# Patient Record
Sex: Female | Born: 1955 | Race: White | Hispanic: No | Marital: Married | Smoking: Former smoker
Health system: Southern US, Community
[De-identification: ages and names within clinical notes are randomized; demographics above are authoritative.]

## PROBLEM LIST (undated history)

## (undated) DIAGNOSIS — N183 Chronic kidney disease, stage 3 unspecified: Secondary | ICD-10-CM

## (undated) DIAGNOSIS — J330 Polyp of nasal cavity: Secondary | ICD-10-CM

## (undated) DIAGNOSIS — I890 Lymphedema, not elsewhere classified: Secondary | ICD-10-CM

## (undated) DIAGNOSIS — I5032 Chronic diastolic (congestive) heart failure: Secondary | ICD-10-CM

## (undated) DIAGNOSIS — E662 Morbid (severe) obesity with alveolar hypoventilation: Secondary | ICD-10-CM

## (undated) DIAGNOSIS — G5600 Carpal tunnel syndrome, unspecified upper limb: Secondary | ICD-10-CM

## (undated) DIAGNOSIS — M109 Gout, unspecified: Secondary | ICD-10-CM

## (undated) DIAGNOSIS — Z87891 Personal history of nicotine dependence: Secondary | ICD-10-CM

## (undated) DIAGNOSIS — J9610 Chronic respiratory failure, unspecified whether with hypoxia or hypercapnia: Secondary | ICD-10-CM

## (undated) DIAGNOSIS — I10 Essential (primary) hypertension: Secondary | ICD-10-CM

## (undated) DIAGNOSIS — Z9981 Dependence on supplemental oxygen: Secondary | ICD-10-CM

## (undated) DIAGNOSIS — E119 Type 2 diabetes mellitus without complications: Secondary | ICD-10-CM

## (undated) DIAGNOSIS — E785 Hyperlipidemia, unspecified: Secondary | ICD-10-CM

## (undated) DIAGNOSIS — C519 Malignant neoplasm of vulva, unspecified: Secondary | ICD-10-CM

## (undated) DIAGNOSIS — J411 Mucopurulent chronic bronchitis: Secondary | ICD-10-CM

## (undated) DIAGNOSIS — M1990 Unspecified osteoarthritis, unspecified site: Secondary | ICD-10-CM

## (undated) DIAGNOSIS — K219 Gastro-esophageal reflux disease without esophagitis: Secondary | ICD-10-CM

## (undated) HISTORY — DX: Chronic kidney disease, stage 3 unspecified: N18.30

## (undated) HISTORY — DX: Carpal tunnel syndrome, unspecified upper limb: G56.00

## (undated) HISTORY — PX: COLONOSCOPY W/ POLYPECTOMY: SHX1380

## (undated) HISTORY — DX: Type 2 diabetes mellitus without complications: E11.9

## (undated) HISTORY — DX: Polyp of nasal cavity: J33.0

## (undated) HISTORY — PX: RADICAL VULVECTOMY: SHX2286

## (undated) HISTORY — DX: Hyperlipidemia, unspecified: E78.5

## (undated) HISTORY — DX: Lymphedema, not elsewhere classified: I89.0

## (undated) HISTORY — DX: Chronic diastolic (congestive) heart failure: I50.32

## (undated) HISTORY — DX: Unspecified osteoarthritis, unspecified site: M19.90

## (undated) HISTORY — DX: Gastro-esophageal reflux disease without esophagitis: K21.9

## (undated) HISTORY — DX: Essential (primary) hypertension: I10

## (undated) HISTORY — DX: Morbid (severe) obesity with alveolar hypoventilation: E66.2

## (undated) HISTORY — DX: Dependence on supplemental oxygen: Z99.81

## (undated) HISTORY — PX: LYMPHADENECTOMY: SHX15

## (undated) HISTORY — DX: Chronic respiratory failure, unspecified whether with hypoxia or hypercapnia: J96.10

## (undated) HISTORY — DX: Chronic kidney disease, stage 3 (moderate): N18.3

---

## 1999-11-22 ENCOUNTER — Other Ambulatory Visit: Admission: RE | Admit: 1999-11-22 | Discharge: 1999-11-22 | Payer: Self-pay | Admitting: Obstetrics and Gynecology

## 1999-11-22 ENCOUNTER — Encounter (INDEPENDENT_AMBULATORY_CARE_PROVIDER_SITE_OTHER): Payer: Self-pay | Admitting: Specialist

## 1999-11-29 ENCOUNTER — Ambulatory Visit: Admission: RE | Admit: 1999-11-29 | Discharge: 1999-11-29 | Payer: Self-pay | Admitting: Gynecologic Oncology

## 1999-12-28 ENCOUNTER — Ambulatory Visit: Admission: RE | Admit: 1999-12-28 | Discharge: 1999-12-28 | Payer: Self-pay | Admitting: Gynecology

## 2000-01-03 ENCOUNTER — Ambulatory Visit: Admission: RE | Admit: 2000-01-03 | Discharge: 2000-01-03 | Payer: Self-pay | Admitting: Gynecologic Oncology

## 2000-01-18 ENCOUNTER — Ambulatory Visit: Admission: RE | Admit: 2000-01-18 | Discharge: 2000-01-18 | Payer: Self-pay | Admitting: Gynecology

## 2000-02-29 ENCOUNTER — Ambulatory Visit: Admission: RE | Admit: 2000-02-29 | Discharge: 2000-02-29 | Payer: Self-pay | Admitting: Gynecologic Oncology

## 2000-05-22 ENCOUNTER — Ambulatory Visit: Admission: RE | Admit: 2000-05-22 | Discharge: 2000-05-22 | Payer: Self-pay | Admitting: Gynecologic Oncology

## 2000-08-14 ENCOUNTER — Ambulatory Visit: Admission: RE | Admit: 2000-08-14 | Discharge: 2000-08-14 | Payer: Self-pay | Admitting: Gynecology

## 2000-08-14 ENCOUNTER — Other Ambulatory Visit: Admission: RE | Admit: 2000-08-14 | Discharge: 2000-08-14 | Payer: Self-pay | Admitting: Gynecology

## 2000-11-06 ENCOUNTER — Other Ambulatory Visit: Admission: RE | Admit: 2000-11-06 | Discharge: 2000-11-06 | Payer: Self-pay | Admitting: Gynecology

## 2000-11-06 ENCOUNTER — Ambulatory Visit: Admission: RE | Admit: 2000-11-06 | Discharge: 2000-11-06 | Payer: Self-pay | Admitting: Gynecology

## 2001-02-19 ENCOUNTER — Other Ambulatory Visit: Admission: RE | Admit: 2001-02-19 | Discharge: 2001-02-19 | Payer: Self-pay | Admitting: Gynecology

## 2001-02-19 ENCOUNTER — Ambulatory Visit: Admission: RE | Admit: 2001-02-19 | Discharge: 2001-02-19 | Payer: Self-pay | Admitting: Gynecology

## 2001-05-13 ENCOUNTER — Ambulatory Visit: Admission: RE | Admit: 2001-05-13 | Discharge: 2001-05-13 | Payer: Self-pay | Admitting: Gynecology

## 2001-07-24 ENCOUNTER — Other Ambulatory Visit: Admission: RE | Admit: 2001-07-24 | Discharge: 2001-07-24 | Payer: Self-pay | Admitting: Gynecologic Oncology

## 2001-07-24 ENCOUNTER — Ambulatory Visit: Admission: RE | Admit: 2001-07-24 | Discharge: 2001-07-24 | Payer: Self-pay | Admitting: Gynecologic Oncology

## 2001-07-24 ENCOUNTER — Encounter (INDEPENDENT_AMBULATORY_CARE_PROVIDER_SITE_OTHER): Payer: Self-pay | Admitting: *Deleted

## 2001-09-02 ENCOUNTER — Encounter (INDEPENDENT_AMBULATORY_CARE_PROVIDER_SITE_OTHER): Payer: Self-pay

## 2001-09-02 ENCOUNTER — Ambulatory Visit (HOSPITAL_COMMUNITY): Admission: RE | Admit: 2001-09-02 | Discharge: 2001-09-02 | Payer: Self-pay | Admitting: Physical Therapy

## 2001-11-07 ENCOUNTER — Other Ambulatory Visit: Admission: RE | Admit: 2001-11-07 | Discharge: 2001-11-07 | Payer: Self-pay | Admitting: Obstetrics and Gynecology

## 2002-01-31 ENCOUNTER — Ambulatory Visit: Admission: RE | Admit: 2002-01-31 | Discharge: 2002-01-31 | Payer: Self-pay | Admitting: Gynecologic Oncology

## 2002-04-30 ENCOUNTER — Ambulatory Visit: Admission: RE | Admit: 2002-04-30 | Discharge: 2002-04-30 | Payer: Self-pay | Admitting: Gynecologic Oncology

## 2002-04-30 ENCOUNTER — Other Ambulatory Visit: Admission: RE | Admit: 2002-04-30 | Discharge: 2002-04-30 | Payer: Self-pay | Admitting: Gynecologic Oncology

## 2002-07-21 ENCOUNTER — Emergency Department (HOSPITAL_COMMUNITY): Admission: EM | Admit: 2002-07-21 | Discharge: 2002-07-21 | Payer: Self-pay | Admitting: Emergency Medicine

## 2002-07-21 ENCOUNTER — Encounter: Payer: Self-pay | Admitting: Emergency Medicine

## 2002-07-28 ENCOUNTER — Encounter: Admission: RE | Admit: 2002-07-28 | Discharge: 2002-07-28 | Payer: Self-pay | Admitting: Internal Medicine

## 2002-08-21 ENCOUNTER — Encounter: Admission: RE | Admit: 2002-08-21 | Discharge: 2002-08-21 | Payer: Self-pay | Admitting: Obstetrics and Gynecology

## 2002-09-10 ENCOUNTER — Encounter: Admission: RE | Admit: 2002-09-10 | Discharge: 2002-09-10 | Payer: Self-pay | Admitting: Internal Medicine

## 2002-09-17 ENCOUNTER — Ambulatory Visit (HOSPITAL_COMMUNITY): Admission: RE | Admit: 2002-09-17 | Discharge: 2002-09-17 | Payer: Self-pay | Admitting: Internal Medicine

## 2002-09-20 ENCOUNTER — Encounter: Payer: Self-pay | Admitting: Internal Medicine

## 2002-09-20 ENCOUNTER — Ambulatory Visit (HOSPITAL_COMMUNITY): Admission: RE | Admit: 2002-09-20 | Discharge: 2002-09-20 | Payer: Self-pay | Admitting: Internal Medicine

## 2002-10-02 ENCOUNTER — Encounter: Payer: Self-pay | Admitting: Internal Medicine

## 2002-10-02 ENCOUNTER — Encounter: Admission: RE | Admit: 2002-10-02 | Discharge: 2002-10-02 | Payer: Self-pay | Admitting: Internal Medicine

## 2002-10-08 ENCOUNTER — Encounter: Admission: RE | Admit: 2002-10-08 | Discharge: 2002-10-08 | Payer: Self-pay | Admitting: Internal Medicine

## 2002-11-03 ENCOUNTER — Encounter: Admission: RE | Admit: 2002-11-03 | Discharge: 2002-11-03 | Payer: Self-pay | Admitting: Obstetrics and Gynecology

## 2002-12-04 ENCOUNTER — Encounter: Payer: Self-pay | Admitting: Emergency Medicine

## 2002-12-04 ENCOUNTER — Emergency Department (HOSPITAL_COMMUNITY): Admission: EM | Admit: 2002-12-04 | Discharge: 2002-12-04 | Payer: Self-pay | Admitting: Emergency Medicine

## 2002-12-18 ENCOUNTER — Encounter: Admission: RE | Admit: 2002-12-18 | Discharge: 2002-12-18 | Payer: Self-pay | Admitting: Internal Medicine

## 2003-01-26 ENCOUNTER — Encounter: Admission: RE | Admit: 2003-01-26 | Discharge: 2003-01-26 | Payer: Self-pay | Admitting: Obstetrics and Gynecology

## 2003-03-12 ENCOUNTER — Encounter: Admission: RE | Admit: 2003-03-12 | Discharge: 2003-03-12 | Payer: Self-pay | Admitting: Internal Medicine

## 2003-03-16 ENCOUNTER — Encounter: Admission: RE | Admit: 2003-03-16 | Discharge: 2003-03-16 | Payer: Self-pay | Admitting: Internal Medicine

## 2003-04-20 ENCOUNTER — Encounter: Admission: RE | Admit: 2003-04-20 | Discharge: 2003-04-20 | Payer: Self-pay | Admitting: Obstetrics and Gynecology

## 2003-05-20 ENCOUNTER — Encounter (INDEPENDENT_AMBULATORY_CARE_PROVIDER_SITE_OTHER): Payer: Self-pay | Admitting: Specialist

## 2003-05-20 ENCOUNTER — Ambulatory Visit: Admission: RE | Admit: 2003-05-20 | Discharge: 2003-05-20 | Payer: Self-pay | Admitting: Gynecology

## 2003-05-20 ENCOUNTER — Other Ambulatory Visit: Admission: RE | Admit: 2003-05-20 | Discharge: 2003-05-20 | Payer: Self-pay | Admitting: Gynecology

## 2003-07-13 ENCOUNTER — Encounter: Admission: RE | Admit: 2003-07-13 | Discharge: 2003-07-13 | Payer: Self-pay | Admitting: Family Medicine

## 2003-08-04 ENCOUNTER — Encounter: Admission: RE | Admit: 2003-08-04 | Discharge: 2003-08-04 | Payer: Self-pay | Admitting: Internal Medicine

## 2003-08-13 ENCOUNTER — Ambulatory Visit (HOSPITAL_COMMUNITY): Admission: RE | Admit: 2003-08-13 | Discharge: 2003-08-13 | Payer: Self-pay | Admitting: Internal Medicine

## 2003-08-21 ENCOUNTER — Encounter: Admission: RE | Admit: 2003-08-21 | Discharge: 2003-08-21 | Payer: Self-pay | Admitting: Internal Medicine

## 2003-09-28 ENCOUNTER — Encounter: Admission: RE | Admit: 2003-09-28 | Discharge: 2003-09-28 | Payer: Self-pay | Admitting: Obstetrics and Gynecology

## 2003-12-21 ENCOUNTER — Encounter: Admission: RE | Admit: 2003-12-21 | Discharge: 2003-12-21 | Payer: Self-pay | Admitting: Obstetrics and Gynecology

## 2004-01-22 ENCOUNTER — Encounter: Admission: RE | Admit: 2004-01-22 | Discharge: 2004-01-22 | Payer: Self-pay | Admitting: Internal Medicine

## 2004-02-12 ENCOUNTER — Encounter: Admission: RE | Admit: 2004-02-12 | Discharge: 2004-02-12 | Payer: Self-pay | Admitting: Internal Medicine

## 2004-03-14 ENCOUNTER — Encounter: Admission: RE | Admit: 2004-03-14 | Discharge: 2004-03-14 | Payer: Self-pay | Admitting: Obstetrics and Gynecology

## 2004-04-06 ENCOUNTER — Encounter: Admission: RE | Admit: 2004-04-06 | Discharge: 2004-04-06 | Payer: Self-pay | Admitting: Internal Medicine

## 2004-05-10 ENCOUNTER — Other Ambulatory Visit: Admission: RE | Admit: 2004-05-10 | Discharge: 2004-05-10 | Payer: Self-pay | Admitting: Gynecologic Oncology

## 2004-05-10 ENCOUNTER — Encounter (INDEPENDENT_AMBULATORY_CARE_PROVIDER_SITE_OTHER): Payer: Self-pay | Admitting: *Deleted

## 2004-05-10 ENCOUNTER — Ambulatory Visit: Admission: RE | Admit: 2004-05-10 | Discharge: 2004-05-10 | Payer: Self-pay | Admitting: Gynecologic Oncology

## 2004-05-31 ENCOUNTER — Ambulatory Visit: Payer: Self-pay | Admitting: Obstetrics & Gynecology

## 2004-07-11 ENCOUNTER — Ambulatory Visit: Payer: Self-pay | Admitting: Internal Medicine

## 2004-07-12 ENCOUNTER — Ambulatory Visit (HOSPITAL_COMMUNITY): Admission: RE | Admit: 2004-07-12 | Discharge: 2004-07-12 | Payer: Self-pay | Admitting: Internal Medicine

## 2004-07-12 ENCOUNTER — Ambulatory Visit: Payer: Self-pay | Admitting: Internal Medicine

## 2004-07-15 ENCOUNTER — Ambulatory Visit: Payer: Self-pay | Admitting: Internal Medicine

## 2004-07-22 ENCOUNTER — Emergency Department (HOSPITAL_COMMUNITY): Admission: EM | Admit: 2004-07-22 | Discharge: 2004-07-22 | Payer: Self-pay | Admitting: Family Medicine

## 2004-07-27 ENCOUNTER — Ambulatory Visit: Payer: Self-pay | Admitting: Internal Medicine

## 2004-08-05 ENCOUNTER — Ambulatory Visit: Payer: Self-pay | Admitting: Internal Medicine

## 2004-08-16 ENCOUNTER — Ambulatory Visit: Payer: Self-pay | Admitting: *Deleted

## 2004-09-06 ENCOUNTER — Ambulatory Visit: Payer: Self-pay | Admitting: Internal Medicine

## 2004-09-08 ENCOUNTER — Ambulatory Visit: Payer: Self-pay | Admitting: Internal Medicine

## 2004-10-26 ENCOUNTER — Ambulatory Visit: Payer: Self-pay | Admitting: Internal Medicine

## 2004-11-01 ENCOUNTER — Ambulatory Visit: Payer: Self-pay | Admitting: Obstetrics and Gynecology

## 2004-11-09 ENCOUNTER — Ambulatory Visit: Payer: Self-pay | Admitting: Internal Medicine

## 2005-01-19 ENCOUNTER — Ambulatory Visit: Payer: Self-pay | Admitting: Obstetrics & Gynecology

## 2005-01-31 ENCOUNTER — Ambulatory Visit: Payer: Self-pay | Admitting: Internal Medicine

## 2005-02-04 ENCOUNTER — Ambulatory Visit (HOSPITAL_COMMUNITY): Admission: RE | Admit: 2005-02-04 | Discharge: 2005-02-04 | Payer: Self-pay | Admitting: Internal Medicine

## 2005-04-20 ENCOUNTER — Ambulatory Visit: Payer: Self-pay | Admitting: Obstetrics & Gynecology

## 2005-05-04 ENCOUNTER — Ambulatory Visit: Payer: Self-pay | Admitting: Internal Medicine

## 2005-05-09 ENCOUNTER — Encounter (INDEPENDENT_AMBULATORY_CARE_PROVIDER_SITE_OTHER): Payer: Self-pay | Admitting: *Deleted

## 2005-05-09 ENCOUNTER — Ambulatory Visit: Admission: RE | Admit: 2005-05-09 | Discharge: 2005-05-09 | Payer: Self-pay | Admitting: Gynecologic Oncology

## 2005-05-09 ENCOUNTER — Other Ambulatory Visit: Admission: RE | Admit: 2005-05-09 | Discharge: 2005-05-09 | Payer: Self-pay | Admitting: Gynecologic Oncology

## 2005-07-06 ENCOUNTER — Ambulatory Visit: Payer: Self-pay | Admitting: Obstetrics & Gynecology

## 2005-08-15 ENCOUNTER — Ambulatory Visit: Payer: Self-pay | Admitting: Internal Medicine

## 2005-09-21 ENCOUNTER — Ambulatory Visit: Payer: Self-pay | Admitting: Family Medicine

## 2005-11-17 ENCOUNTER — Ambulatory Visit: Payer: Self-pay | Admitting: Internal Medicine

## 2005-12-06 ENCOUNTER — Ambulatory Visit: Payer: Self-pay | Admitting: Gynecology

## 2005-12-06 ENCOUNTER — Ambulatory Visit (HOSPITAL_COMMUNITY): Admission: RE | Admit: 2005-12-06 | Discharge: 2005-12-06 | Payer: Self-pay | Admitting: Internal Medicine

## 2005-12-28 ENCOUNTER — Encounter: Admission: RE | Admit: 2005-12-28 | Discharge: 2005-12-28 | Payer: Self-pay | Admitting: Internal Medicine

## 2006-07-18 DIAGNOSIS — C519 Malignant neoplasm of vulva, unspecified: Secondary | ICD-10-CM | POA: Insufficient documentation

## 2006-07-18 DIAGNOSIS — K219 Gastro-esophageal reflux disease without esophagitis: Secondary | ICD-10-CM

## 2006-07-18 DIAGNOSIS — R32 Unspecified urinary incontinence: Secondary | ICD-10-CM | POA: Insufficient documentation

## 2006-07-18 DIAGNOSIS — R609 Edema, unspecified: Secondary | ICD-10-CM

## 2006-07-18 DIAGNOSIS — I10 Essential (primary) hypertension: Secondary | ICD-10-CM | POA: Insufficient documentation

## 2006-07-18 DIAGNOSIS — N912 Amenorrhea, unspecified: Secondary | ICD-10-CM

## 2006-07-18 DIAGNOSIS — Z87891 Personal history of nicotine dependence: Secondary | ICD-10-CM

## 2006-07-18 DIAGNOSIS — M8700 Idiopathic aseptic necrosis of unspecified bone: Secondary | ICD-10-CM | POA: Insufficient documentation

## 2006-07-18 DIAGNOSIS — G5600 Carpal tunnel syndrome, unspecified upper limb: Secondary | ICD-10-CM | POA: Insufficient documentation

## 2006-09-15 DIAGNOSIS — J329 Chronic sinusitis, unspecified: Secondary | ICD-10-CM | POA: Insufficient documentation

## 2006-09-15 DIAGNOSIS — J309 Allergic rhinitis, unspecified: Secondary | ICD-10-CM | POA: Insufficient documentation

## 2007-05-29 ENCOUNTER — Encounter: Admission: RE | Admit: 2007-05-29 | Discharge: 2007-05-29 | Payer: Self-pay | Admitting: Family Medicine

## 2008-08-22 IMAGING — OT DG DXA BONE DENSITY STUDY HL7
4 series · 4 of 4 positions shown · non-contrast
Comparison: none

DUAL X-RAY ABSORPTIOMETRY (BONE MINERAL DENSITY):
CLINICAL DATA: Per physician?s office, patient is felt to be perimenopausal. There is a history of rheumatoid arthritis and osteoarthritis.  Patient takes calcium and vitamin D.  She has been on Depo-Provera for seven years.

[Series 13: — · left · 1 of 1 slices shown (1 of 4)]
[im 1/1]
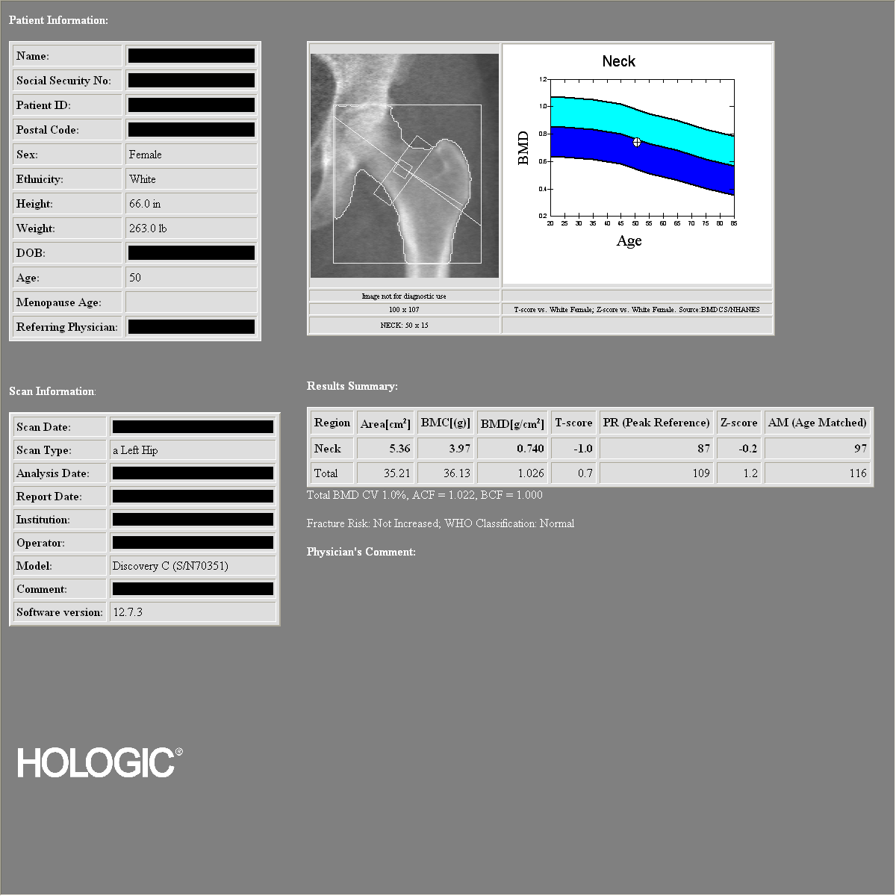

[Series 14: — · 1 of 1 slices shown (2 of 4)]
[im 1/1]
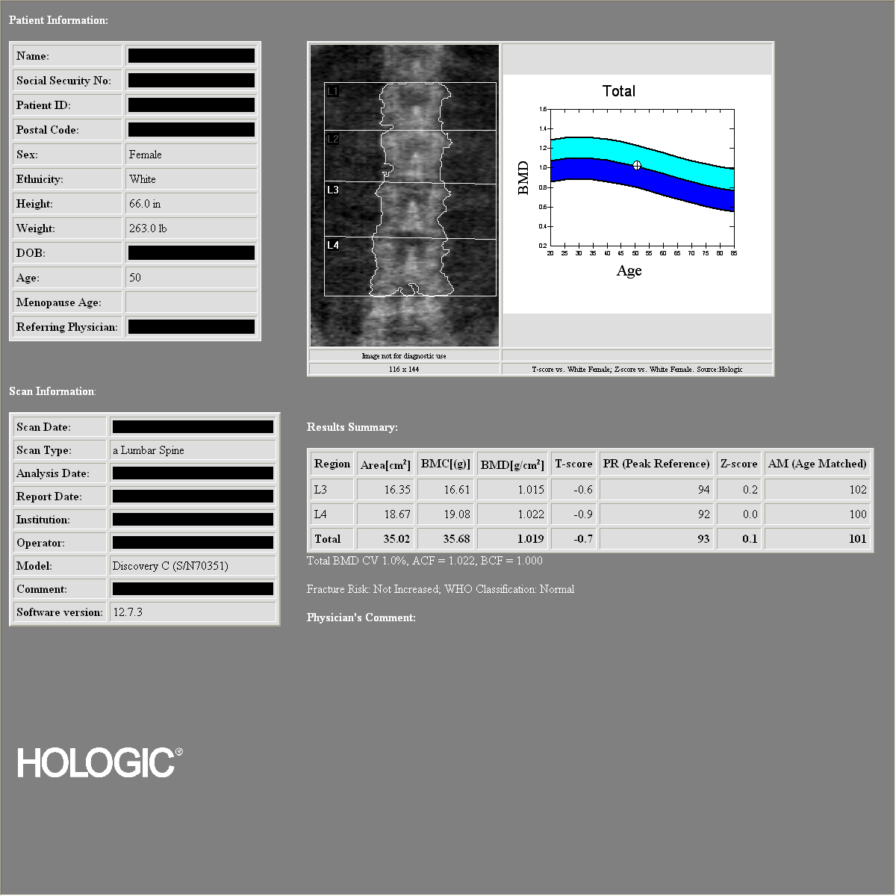

[Series 15: — · left · 1 of 1 slices shown (3 of 4)]
[im 1/1]
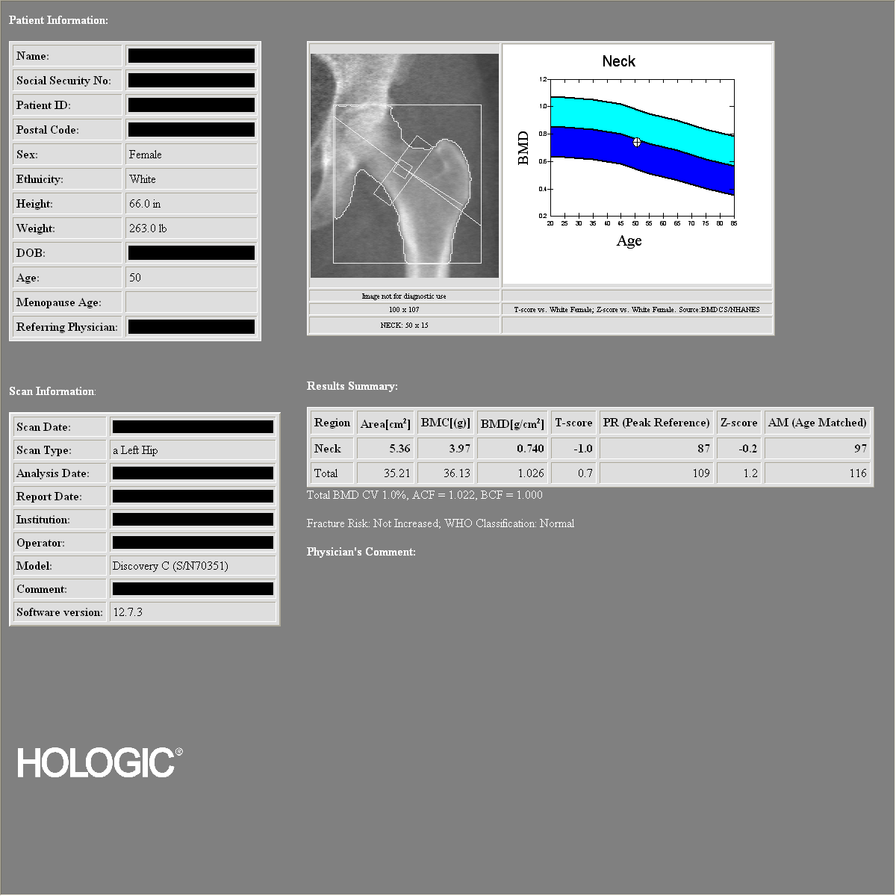

[Series 16: — · 1 of 1 slices shown (4 of 4)]
[im 1/1]
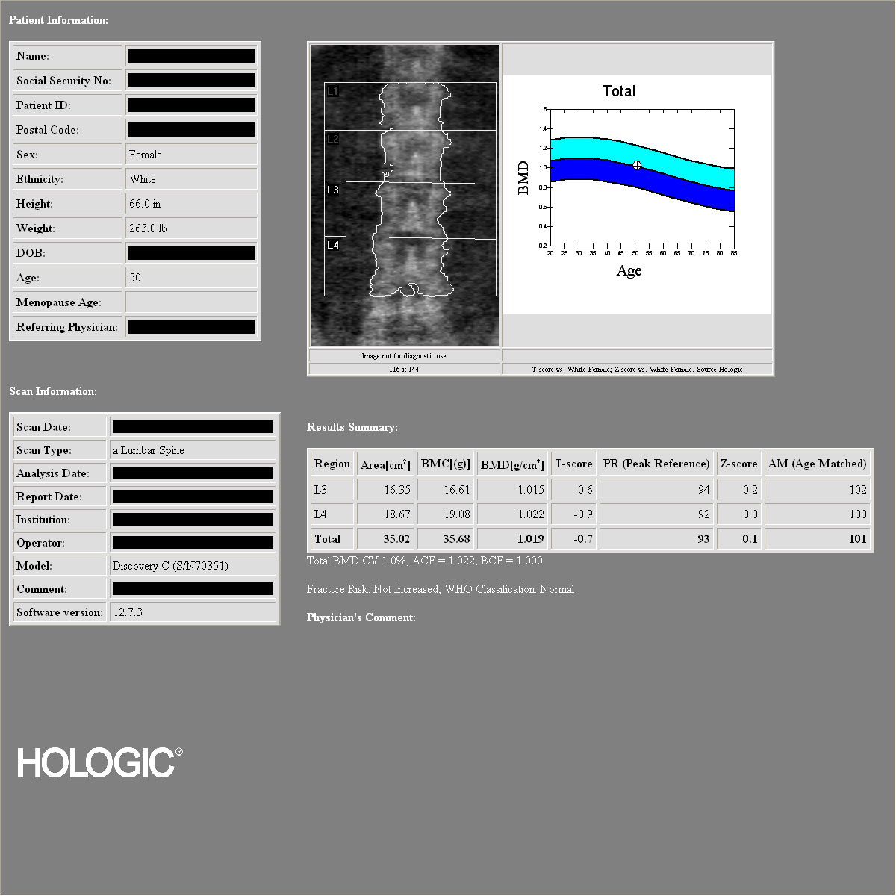

[4 of 4 positions shown; findings below may reference images not displayed]

LUMBAR SPINE (L3-L4)
 BMD:
 T-score (% of young adult value):  -0.7
 Z-score (% adult age match value):

 LEFT HIP (NECK)
 BMD:
 T-score (% of young adult value):  -1.0
 Z-score (% adult age match value):  -0.2

 Assessment:    This patient is considered normal according to the World Health Organization (WHO) criteria.

 Fracture Risk:    Not increased.

 Discussion:    There has been no significant change in bone mineral density in the spine or hip as compared to 10/02/02.  L1 and L2 were excluded due to greater than one standard deviation difference in T-score from adjacent vertebral bodies. All patients should ensure an adequate intake of dietary calcium (4944 mg daily) and vitamin D ([address]) unless contraindicated.  

 DXA-Re-Evaluation Recommendation:   Consider follow-up DXA in two years.  

 World Health Organization Criteria for Osteoporosis 
 Diagnostic Category
 Normal:  T-Score > -1.0
 Osteopenia:   T-Score: < 1.0 and >
 Osteoporosis:   T-Score: < 2.5) w/o history of fx)
 Established Osteoporosis:  T-Score < 2.5 (with history of fx)
 *Note:  T-Score = SD of young adult mean

## 2009-01-14 ENCOUNTER — Encounter: Admission: RE | Admit: 2009-01-14 | Discharge: 2009-01-14 | Payer: Self-pay | Admitting: Family Medicine

## 2009-06-01 ENCOUNTER — Encounter: Admission: RE | Admit: 2009-06-01 | Discharge: 2009-06-01 | Payer: Self-pay | Admitting: Family Medicine

## 2011-01-20 NOTE — Consult Note (Signed)
NAME:  [name] [name], [name] [name]                        ACCOUNT NO.:  [id number]   MEDICAL RECORD NO.:  [id number]                   PATIENT TYPE:  OUT   LOCATION:  GYN                                  FACILITY:  [facility]   PHYSICIAN:  John T. Kyla Balzarine, M.D.                 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   HISTORY OF PRESENT ILLNESS:  The patient underwent radical vulvectomy and  bilateral inguinal lymphadenectomy in April 2001.  She underwent Martius  flap reconstruction for a locally very advanced, but superficially invasive  squamous malignancy.  Nodes were negative.  She has had problems with  intermittent lymphedema.   INTERVAL HISTORY:  Since she was last seen she was evaluated by a  gastroenterologist with negative colonoscopy.  Last Pap smear in March was  normal.   PAST MEDICAL HISTORY:  Peptic ulcer disease, cesarean section, chronic  lymphedema.   PAST SURGICAL HISTORY:  As above.   CURRENT MEDICATIONS:  Prevacid, Depo Provera, and Aldactazide.   ALLERGIES:  None.   SOCIAL HISTORY:  Essentially unchanged from those recorded during her  surgical admission at [facility] and as noted above.   FAMILY HISTORY:  Essentially unchanged from those recorded during her  surgical admission at [facility] and as noted above.   REVIEW OF SYMPTOMS:  Essentially unchanged from those recorded during her  surgical admission at [facility] and as noted above.   PHYSICAL EXAMINATION:  VITAL SIGNS:  Weight 234 pounds, blood pressure  140/86.  GENERAL:  The patient is alert and oriented x3, in no acute distress.  LYMPH:  There is no pathologic lymphadenopathy.  LUNGS:  Clear.  BACK:  There is no back or CVA tenderness.  ABDOMEN:  Soft and benign without ascites, mass, or organomegaly.  EXTREMITIES:  Without edema.  PELVIC:  External genitalia and BUS  are scarred from prior surgical  procedure but there is no evidence of an acetowhite lesion or condylomatous  lesion.  The vagina is clear as is the cervix.  Bimanual and rectovaginal  examinations reveal normal uterus and cervix within the limits for obesity.  Adnexa are nonpalpable.  RECTAL:  Normal and confirmatory.   ASSESSMENT:  Vulvar carcinoma, NED.   PLAN:  Depo Provera is given and will be repeated every few months.  She  will continue to alternate follow-up between Dr. Katrinka Blazing and myself.                                               John T. Kyla Balzarine, M.D.    JTS/MEDQ  D:  04/30/2002  T:  05/01/2002  Job:  [id number]   cc:   Laqueta Linden, M.D.  [address], [address]  [place]  [place] [address]  Fax: [phone]   Rema Fendt   Barrie Folk, M.D.  [phone] [address]  [place]  [place] [address]  Fax: [phone]

## 2011-01-20 NOTE — Consult Note (Signed)
[facility]  Patient:    [name] [name], [name] [name]                           MRN: [id number] Adm. Date:  [id number] Attending:  Ronita Hipps [place] CC:         Laqueta Linden, M.D.             Telford Nab, R.N., GYN Oncology Unit, [facility].                          Consultation Report  HISTORY OF PRESENT ILLNESS:  This is a 55 year old woman who returns for ongoing followup after modified radical vulvectomy and bilateral inguinal lymphadenectomy at [facility] on December 15, 1999.  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  She had negative inguinal lymph nodes.  Postoperatively, she had right lymphedema which responded favorably to Aldactazide 25/25.  She notes no difficulty in ambulation but she does note a sensation of "burning" discomfort,  particularly at night, in the distribution of the right femoral cutaneous nerve. She denies cords or tenderness in the calves.  Her Jackson-Pratt drains have been discontinued and she is treating a posterior wound separation with Sitz baths three to four times daily.  She is doing quite well with this but does note some irritation and "galding" of the labial-crural fold.  PHYSICAL EXAMINATION:  VITAL SIGNS:  Afebrile and benign.  PELVIC:  Groin incisions are well-healed without inflammation, although there is superficial skin necrosis in the mid left incision, back approximately 1 cm from the incision.  There is erythema and thickening of the labial-crural folds without evidence of Monilia.  There is edema of the mons.  The posterior suture line has separated across the perineum and into the left gluteal region but is granulating well, without areas of necrosis and the anterior suture line is intact. Residual stitches are removed.  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  PLAN:  Patient was encouraged to continue her Sitz baths and topical care.  She was given a prescription for 1% hydrocortisone ointment to be used in the labial-crural folds.  We will see her back for followup in approximal two weeks. DD:  01/03/00 TD:  01/04/00 Job: [id number]

## 2011-01-20 NOTE — Procedures (Signed)
[facility]  Patient:    [name] [name], [name] [name] Visit Number: [id number] MRN: [id number]          Service Type: END Location: ENDO Attending Physician:  Louie Bun Proc. Date: 09/02/01 Admit Date:  09/02/2001   CC:         John T. Kyla Balzarine, M.D.                           Procedure Report  PROCEDURE PERFORMED:  Colonoscopy with polypectomy.  ENDOSCOPIST:  Everardo All. Madilyn Fireman, M.D.  INDICATION:  Rectal bleeding in a patient recently status post surgery for vulvar carcinoma.  DESCRIPTION OF PROCEDURE:  The patient was placed in the left lateral decubitus position and placed on the pulse monitor with continuous low flow oxygen delivered via nasal cannula.  She was sedated with 100 mcg IV Demerol and 10 mg IV Versed.  The Olympus video colonoscope was inserted into the rectum and advanced to the cecum, confirmed by transillumination of McBurneys point, and visualization of the ileocecal valve and appendiceal orifice.  The prep was fair.  There were some areas of limitation that I could not preclude small lesions less than 1 cm in all areas, otherwise the cecum and ascending colon appeared normal.  Within the transverse colon, there was seen a 1.2 cm pedunculated polyp, which was removed by snare.  The tissue was fragmented when brought through the scope and only small pieces were retained for histologic evaluation.  The remainder of the transverse and descending colon appeared normal with no further polyps, masses, diverticula, or other mucosal abnormalities.  Within the sigmoid colon, there was a larger approximately 1.5 cm pedunculated polyp which was removed by snare in one piece.  The remaining the sigmoid and proximal rectum appeared normal.  Retroflex view of the anus did reveal some moderate size internal hemorrhoids.  The colonoscope was then withdrawn and the patient returned to the recovery room in stable condition. She tolerated the procedure well  and there were no immediate complications.  IMPRESSION: 1. Two moderately large colon polyps. 2. Internal hemorrhoids.  PLAN: 1. Await histology to determine method and interval for future    colon screening. 2. Will treat hemorrhoids symptomatically. Attending Physician:  Louie Bun DD:  09/02/01 TD:  09/02/01 Job: [id number]

## 2011-01-20 NOTE — Consult Note (Signed)
[facility]  Patient:    [name] [name], [name] [name]                     MRN: [id number] Proc. Date: 02/19/01 Adm. Date:  [id number] Attending:  Jeannette Corpus CC:         [name] [name], M.D.  Rema Fendt, N.P., [facility]  Telford Nab, R.N.   Consultation Report  HISTORY OF PRESENT ILLNESS:  [age]-year-old [name] [name] female returns for continuing followup of a stage I vulvar carcinoma, undergoing initial surgical resection, April of 2001.  She had negative nodes and negative margins.  Since her last visit, she has had no new complaints.  She does have some numbness in her anterior thighs and on her vulva.  She denies any vaginal bleeding, discharge or any vulvar lesions.  She is using Depo-Provera for management of hot flushes and she is not having any bleeding.  REVIEW OF SYSTEMS:  Otherwise negative.  FAMILY HISTORY AND SOCIAL HISTORY:  Reviewed and unchanged.  PHYSICAL EXAMINATION:  VITAL SIGNS:  Weight 215 pounds.  Blood pressure 140/100.  GENERAL:  Patient is a healthy [name] [name] female in no acute distress.  HEENT:  Negative.  NECK:  Supple without thyromegaly.  NODES:  There is no supraclavicular or inguinal adenopathy.  ABDOMEN:  Soft, obese and nontender.  No mass, organomegaly, ascites or herniae are noted.  Inguinal incisions are well-healed and no lymphocysts are noted.  EXTREMITIES:  Lower extremities do not have any lymphedema.  PELVIC:  EGBUS normal except for surgical changes, especially on the left side.  All this has healed and there are no lesions.  Vagina is without lesions.  Cervix is normal.  Uterus is anterior, normal shape, size and consistency.  There are no adnexal masses noted.  Rectovaginal exam confirms.  IMPRESSION:  Vulvar cancer with no evidence of metastatic disease and no evidence of recurrence.  Pap smears are obtained.  Patient is given an injection of Depo-Provera 150 mg  today.  She will return to see Dr. Laqueta [name] in six months and return to see [place] in one year. DD:  02/19/01 TD:  02/20/01 Job: [id number]

## 2011-01-20 NOTE — Consult Note (Signed)
[facility]  Patient:    KJIRSTEN, BLOODGOOD                     MRN: [id number] Proc. Date: 08/14/00 Adm. Date:  [id number] Disc. Date: [id number] Attending:  Ronita Hipps T CC:         Laqueta Linden, M.D.  Telford Nab, R.N.   Consultation Report  HISTORY:  [age]-year-old Jann female returns for continuing followup of vulvar carcinoma, undergoing a radical vulvectomy and bilateral inguinal lymphadenectomy, April 2001.  Since her last visit, she has done well.  She denies any GI or GU symptoms. She has no pelvic pain, pressure, vaginal bleeding or discharge.  She specifically denies any symptoms on the vulva.  The patient is using Depo-Provera for contraception.  Past medical history, surgical history, current medications, allergies and personal and social history are reviewed and unchanged from a notation of May 22, 2000.  REVIEW OF SYSTEMS:  Review of systems reveals no cardiovascular, pulmonary, neurologic, GI or GU symptoms.  PHYSICAL EXAMINATION  VITAL SIGNS:  Weight 212 pounds (down 7-1/2 pounds since September).  Blood pressure 150/80.  GENERAL:  The patient is a healthy Mick female in no acute distress.  HEENT:  Negative.  NECK:  Supple without thyromegaly.  NODES:  There is no supraclavicular or inguinal adenopathy.  ABDOMEN:  The inguinal incisions are well-healed.  EXTREMITIES:  The patient has 1+ edema of both legs.  PELVIC:  EG/BUS normal except for the changes associated with the posterior vulvectomy.  The scar is well-healed.  The introitus is not stenotic.  The vagina is clean.  Cervix is normal.  Uterus is anterior, normal shape, size and consistency.  There are no adnexal masses noted.  Rectovaginal exam confirms.  IMPRESSION:  Vulvar carcinoma, status post radical vulvectomy, April 2000, no evidence of recurrent disease.  Pap smears are obtained.  The patient is given a Depo-Provera injection 150 mg  IM today.  She will return to see me in three months for continuing followup. DD:  08/14/00 TD:  08/14/00 Job: [id number]

## 2011-01-20 NOTE — Consult Note (Signed)
NAME:  [name] [name], [name] [name]                            ACCOUNT NO.:  [id number]   MEDICAL RECORD NO.:  [id number]                   PATIENT TYPE:  OUT   LOCATION:  GYN                                  FACILITY:  [facility]   PHYSICIAN:  John T. Kyla Balzarine, M.D.                 DATE OF BIRTH:  Jun 24, 1956   DATE OF CONSULTATION:  05/10/2004  DATE OF DISCHARGE:                                   CONSULTATION   CHIEF COMPLAINT:  This 55 year old woman returns for follow-up of vulvar  cancer.   INTERVAL HISTORY:  The patient has no new vulvar or gynecologic symptoms.  She received Depo-Provera for contraception, [facility].  She had  menopausal gonadotropin levels obtained recently, and results are available  for review.  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  She is considering surgery.  She has developed ankle edema since developing  this.  Her functional status is otherwise good.   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  Nodes were  negative, and she received no further treatment.  She has chronic mild  lymphedema involving her right lower extremity, exacerbated by an ankle  fracture.   PAST MEDICAL HISTORY:  1.  Peptic ulcer disease.  2.  Cesarean section.  3.  Chronic lymphedema.  4.  Nonunion fracture of the right ankle.   PAST SURGICAL HISTORY:  Radical vulvectomy and nodes.   MEDICATIONS:  Prevacid, Depo-Provera, Aldactazide.   ALLERGIES:  None.   Personal/social history, family history, and review of systems are unchanged  from those recorded during multiple encounters dating back to March 2001.   PHYSICAL EXAMINATION:  VITAL SIGNS:  Weight 246 pounds, blood pressure  130/80.  GENERAL:  The patient is alert and oriented x 3, in no acute distress.  LYMPH:  No supraclavicular or  inguinal lymphadenopathy.  ABDOMEN:  Prior Pfannenstiel incision, well-healed.  No ascites, mass, or  organomegaly.  No tenderness.  BACK:  No tenderness, and the is no CVA tenderness.  PELVIC:  EG and BUS have scarring from prior surgery with an incision that  goes laterally into the buttocks.  No lesions noted and no contracture of  the introitus.  VAGINA:  Good support without lesions.  CERVIX:  No lesions; nontender to manipulation.  On bimanual and  rectovaginal examinations, the uterus is normal size and shape, anterior,  and there are no adnexal masses noted.  Rectovaginal examination confirms.   LABORATORY DATA:  FSH level from May 2005 was 45 with LH of 38.5.  Both are  in the low menopausal ranges.   ASSESSMENT:  1.  Stage II vulvar carcinoma, NAD.  2.  Mild edema of the right lower extremity exacerbated by nonunion  fracture.  Likely early menopause.   PLAN:  I recommend that the patient have her gonadotropin levels checked  after 3 months off of Depo-Provera and if they are consistently elevated,  she likely is in permanent menopause and could discontinue Depo-Provera.  She was instructed to use barrier contraception until this has been  documented.  We can see her back in follow up in 1 year or sooner on a  p.r.n. basis.                                               John T. Kyla Balzarine, M.D.    JTS/MEDQ  D:  05/10/2004  T:  05/10/2004  Job:  [id number]   cc:   Telford Nab, R.N.  [address]  [place], [place] [address]   Rema Fendt, MD   [facility]   [facility] [employer]

## 2011-01-20 NOTE — Consult Note (Signed)
[facility]  Patient:    [name] [name], [name] [name]                     MRN: [id number] Adm. Date:  [id number] Attending:  Ronita Hipps T CC:         Laqueta Linden, M.D.  Telford Nab, R.N.   Consultation Report  GYNECOLOGIC-ONCOLOGY CLINIC  CHIEF COMPLAINT:  Ms. [name] [name] returns in need of her Depo-Provera and for reevaluation for vulvar cancer.  In the interval since last followup, she has noted weight gain and has noted increased swelling of her legs, despite using Aldactazide.  Overall, she does feel as if she has generalized fluid distention.  She has no Aldactazide because she has run out of money for medications and is no longer insured.  She continues to work full-time.  HISTORY OF PRESENT ILLNESS:  The patient had a large warty squamous cell carcinoma of the vulva, undergoing radical vulvectomy with Martius flap reconstruction and bilateral groin node dissection on December 20, 1999.  PAST MEDICAL HISTORY:  Significant for peptic ulcer disease.  Cesarean section.  PAST SURGICAL HISTORY:  As above.  CURRENT MEDICATIONS:  Prevacid, Depo-Provera and Aldactazide.  ALLERGIES:  None known.  PERSONAL/SOCIAL HISTORY:  The patient remains a nonsmoker.  She is married.  REVIEW OF SYSTEMS:  As above.  EXAMINATION  GENERAL:  Weight 219.5 pounds (increased 20 pounds since intake).  VITAL SIGNS:  Blood pressure 152/84.  ENT:  Benign with clear oropharynx.  NECK:  Supple without goiter.  LYMPHATICS:  There is no pathologic lymphadenopathy.  LUNGS:  Lung fields are clear.  BACK:  There is no back or CVA tenderness.  ABDOMEN:  The abdomen is soft and benign.  EXTREMITIES:  Trace pretibial edema and groin incisions are well-healed without ______ .  PELVIC:  External genitalia and BUS are scarred from prior surgeries, without lesions suggesting recurrent disease.  Speculum examination reveals good supported bladder and rectum without mucosal lesions.   Cervix is mobile without lesions.  Bimanual and rectovaginal examinations revealed normal cervix and uterus without adnexal pathology.  ASSESSMENT 1. Vulvar carcinoma, no active disease. 2. Weight gain and fluid retention with Depo-Provera.  Need for continued    contraception.  PLAN:  I had a lengthy discussion with the patient and reassured her regarding todays status.  We should see her back for followup of her vulvar cancer in three months.  We discussed alternatives of contraception but she wishes a repeat injection of Depo-Provera today.  I recommended the [facility] for consideration of IUD insertion if she continues to gain weight and retain fluid. DD:  05/22/00 TD:  05/23/00 Job: [id number]

## 2011-01-20 NOTE — Consult Note (Signed)
[facility]  Patient:    [name] [name], [name] [name] Visit Number: [id number] MRN: [id number]          Service Type: GON Location: GYN Attending Physician:  Sabino Donovan Dictated by:   Jackquline Denmark. Kyla Balzarine, M.D. Proc. Date: 07/24/01 Admit Date:  07/24/2001   CC:         Laqueta Linden, M.D.  Leda Gauze, M.D., [facility]  Telford Nab, R.N.   Consultation Report  CHIEF COMPLAINT:  This patient is a drop-in for evaluation of rectal bleeding and vulvar cancer.  INTERVAL HISTORY:  Since she was last seen by Dr. Reuel Boom L. Clarke-Pearson in June, the patient has noted several days of bright red rectal bleeding, not associated with pain or tenesmus.  She denies melena in the past and does have known hemorrhoids, with a recent flare-up.  She has noted no lesions suggesting recurrent vulvar cancer and has no symptoms of lymphedema other than when she has been on her feet for long periods of time.  HISTORY OF PRESENT ILLNESS:  Patient underwent radical vulvectomy and bilateral inguinal lymphadenectomy in April 2001 for a widespread but superficially invasive squamous carcinoma of the vulva.  Nodes were negative. She has had problems with lymphedema postoperatively and had Martius flap reconstruction.  She has continued to use Aldactazide for lymphedema.  PAST MEDICAL HISTORY:  Significant for peptic ulcer disease.  Cesarean section.  Chronic lymphedema.  PAST SURGICAL HISTORY:  As above.  CURRENT MEDICATIONS:  Prevacid, Provera, Aldactazide.  ALLERGIES:  None.  PERSONAL/SOCIAL HISTORY:  Nonsmoker and married.  FAMILY HISTORY:  Noncontributory.  REVIEW OF SYSTEMS:  Unchanged from those recorded during her surgical admission and as noted above.   PHYSICAL EXAMINATION:  VITAL SIGNS:  Weight 228 pounds.  Blood pressure 142/90, vital signs stable and afebrile.  GENERAL:  The patient is alert and oriented x 3, in no acute  distress.  NODES:  There is no pathologic lymphadenopathy.  BACK:  There is no back or CVA tenderness.  ABDOMEN:  Soft and benign without ascites, mass or tenderness.  EXTREMITIES:  Full range of motion and strength without lymphedema currently. Groin flaps are flat.  PELVIC:  External genitalia are scarred from prior procedure, without hyperkeratotic lesion suggesting recurrence.  Vagina and cervix are clear. Bimanual and rectovaginal examinations are unremarkable with normal uterus and parametria.  There is no adnexal mass.  RECTAL:  External hemorrhoids are noted.  Stool is guaiac positive.  ASSESSMENT: 1. Vulvar carcinoma, no active disease. 2. Rectal bleeding, probably related to hemorrhoids.  PLAN:  Patient will continue to receive Depo-Provera and is scheduled to see Dr. Laqueta Linden in March 2003 and see [place] in six months.  I strongly recommended that she be evaluated by a gastroenterologist.  She has been seen by one in the past and will make these arrangements.  Pap smear is repeated and the results will be communicated to her. Dictated by:   Jackquline Denmark. Kyla Balzarine, M.D. Attending Physician:  Ronita Hipps T DD:  07/24/01 TD:  07/25/01 Job: [id number]

## 2011-01-20 NOTE — Consult Note (Signed)
[facility]  Patient:    [name] [name], [name] [name]                     MRN: [id number] Proc. Date: 01/18/00 Adm. Date:  [id number] Attending:  Jeannette Corpus CC:         Laqueta Linden, M.D.             Telford Nab, R.N.                          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  Her nodes were negative.  Postoperatively she had some lymphedema of the right mons and had some wound breakdown.  She has been using sitz baths and reports that she is having no symptoms.  She denies any bleeding, drainage or pain in the vulva.  The patient does have an upper respiratory infection and is receiving some antibiotics by her local physician.  PHYSICAL EXAMINATION:  ABDOMEN:  Soft, nontender.  No masses, organomegaly, ascites, or hernias noted.  PELVIC:  EG/BUS shows that the area on the left vulva which had separated is granulating beautifully.  There is considerable amount of epithelialization. There is no evidence of infection, erythema or induration.  IMPRESSION:  Status post vulvectomy with wound break down.  The patient is having excellent healing.  She will conillue her current regimen and return to see Dr. Kyla Balzarine on June 20.  She will stay out of work until that time.  We did discuss the patients need for coniraception.  She will discuss this further with Dr. Kyla Balzarine at their next visit. DD:  01/18/00 TD:  01/18/00 Job: [id number]

## 2011-01-20 NOTE — Consult Note (Signed)
[facility]  Patient:    [name], [name]                     MRN: [id number] Proc. Date: 11/06/00 Adm. Date:  [id number] Attending:  Jeannette Corpus CC:         Laqueta Linden, M.D.  Telford Nab, R.N.   Consultation Report  [age]-year-old [name] [name] female returns for continuing followup of a vulvar carcinoma.  She initially underwent radical vulvectomy and bilateral inguinal lymphadenectomy in April of 2001.  She had negative nodes and negative margins and has been followed since that time.  Patients current symptoms are those of urinary frequency and occasional incontinence; she goes to the bathroom four or five times every night.  She denies any dysuria.  In addition, she complains of vulvar "numbness."  Otherwise, she has good GI and GU function.  She is using Depo-Provera for contraception and has occasional spotting while she is taking this.  Overall, she is actually doing quite well.  REVIEW OF SYSTEMS:  Otherwise negative.  She has no cardiovascular, pulmonary, neurologic or GI or GU symptoms except as noted above.  FAMILY HISTORY AND SOCIAL HISTORY:  Reviewed and unchanged.  PHYSICAL EXAMINATION  VITAL SIGNS:  Weight 214 pounds.  GENERAL:  The patient is a healthy [place] female in no acute distress.  HEENT:  Negative.  NECK:  Supple without thyromegaly.  LYMPHATICS:  There is no supraclavicular or inguinal adenopathy.  ABDOMEN:  The inguinal incisions are well-healed.  PELVIC:  EG/BUS are modified considerably from the radical vulvectomy.  All incisions are well-healed.  No lesions are noted.  Vagina is clean.  The urethra appears normal as well.  Cervix has some blood coming from the cervical os.  A Pap smear is repeated.  IMPRESSION 1. Vulvar cancer, status post resection, no evidence of disease. 2. Urinary frequency, occasional incontinence and hesitancy.  I suspect she    probably has an unstable bladder,  therefore, will give her a prescription    for Detrol 2 mg to be used b.i.d.  In addition, it turns out she is using    Aldactazide four times a day for lower extremity edema.  I asked her to cut    back on the Aldactazide to once or at maximum twice a day. 3. The patient was given an injection Depo-Provera 150 mg intramuscularly    today.  PLAN:  She will return to see [place] in three months for continuing surveillance. Pap smears were repeated today. DD:  11/06/00 TD:  11/07/00 Job: [id number]

## 2011-01-20 NOTE — Consult Note (Signed)
NAME:  [name] [name], [name] [name]                  ACCOUNT NO.:  [id number]   MEDICAL RECORD NO.:  [id number]          PATIENT TYPE:  OUT   LOCATION:  GYN                          FACILITY:  [facility]   PHYSICIAN:  John T. Kyla Balzarine, M.D.    DATE OF BIRTH:  08-15-1956   DATE OF CONSULTATION:  05/09/2005  DATE OF DISCHARGE:                                   CONSULTATION   FOLLOW UP GYNECOLOGY/ONCOLOGY NOTE:   CHIEF COMPLAINT:  Follow up of vulvar cancer.   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  Nodes were  negative, and she received no further treatment.  She has chronic mild  lymphedema involving the right leg, exacerbated by a poorly-healing ankle  fracture.  She continues to receive Depo-Provera for contraception, and had  FSH and LH values drawn recently, available for review.  She denies genital  bleeding, lesions, or groin adenopathy.   PAST MEDICAL HISTORY:  1.  Peptic ulcer disease.  2.  Cesarean section.  3.  Chronic lymphedema.  4.  A non-union fracture of the right ankle.  Apparently, she is      contemplating bone graft surgery in the near future.  5.  Bilateral carpal tunnel syndrome, but has not undergone surgery for this      yet.   MEDICATIONS:  1.  Depo-Provera.  2.  Aldactazide.  3.  Prevacid.   ALLERGIES:  None known.   PERSONAL SOCIAL HISTORY/FAMILY HISTORY/REVIEW OF SYSTEMS:  Unchanged from  those reported during multiple encounters dating back to March 2001,  including updating a comprehensive 10-point review of systems that is  negative, other than specified above.   PHYSICAL EXAMINATION:  VITAL SIGNS:  Weight 250 pounds.  Vital signs stable.  LYMPH:  No pathologic lymphadenopathy including no inguinal lymphadenopathy.  ABDOMEN:  Prior Pfannenstiel incision.  Well healed without hernia, ascites,  mass,  tenderness, or organomegaly.  BACK:  No tenderness with no CVA tenderness.  EXTREMITIES:  The patient has a walking cast on her right ankle.  There is  trace pretibial edema on the right.  Other than these limitations, normal  range of motion and strength.  PELVIC:  External genitalia and BUS have scarring from prior surgery.  No  lesions are noted with no contracture at the introitus.  Vaginal is well  supported without lesions.  Normal bladder and urethra.  Cervix - there were  no lesions, and the cervix is normal to inspection and palpation, with no  tenderness to manipulation.  On bimanual and rectovaginal examinations,  uterus is normal, and there are no adnexal or parametrial masses noted.  RECTAL:  Confirmatory.   LABORATORY DATA:  The most recent FSH was 27 with an LH of 12.5.  These  would indicate the patient is unlikely to be menopausal.   ASSESSMENT:  Stage II vulvar carcinoma.  No evidence of disease.   PLAN:  The patient should continue Depo-Provera.  I would recommend that  the  patient's gonadotropin levels (FSH and LH) be evaluated on an annual basis,  and when these are in the menopausal level, she would no longer need Depo-  Provera.  I repeated cytology today, and the results will be communicated to  the patient.  At this juncture, her risk of recurrence of the vulvar cancer  is miniscule, and she could have annual cytology performed in the [facility].  We would certainly be glad to see her back at any time  on a p.r.n. basis.      John T. Kyla Balzarine, M.D.  Electronically Signed     JTS/MEDQ  D:  05/09/2005  T:  05/09/2005  Job:  [id number]   cc:   Sibyl Parr. Darrick Penna, M.D.  Fax: [phone]   Shelbie Proctor. Shawnie Pons, M.D.  Fax: [phone]   Telford Nab, R.N.  [address]  [place], [place] [address]

## 2011-01-20 NOTE — H&P (Signed)
[facility]  Patient:    [name] [name], [name] [name]                     MRN: [id number] Adm. Date:  [id number] Disc. Date: [id number] Attending:  Jeannette Corpus CC:         Laqueta Linden, M.D.             Telford Nab, N.P.                         History and Physical  CHIEF COMPLAINT:  [name] [name] returns for ongoing followup after undergoing a radical vulvectomy and bilateral inguinal lymphadenopathy in April for a giant condyloma with an area of early invasive squamous carcinoma, multifocal, but all with less than 1.0 mm in depth of invasion.  The nodes were negative.  Postoperatively she had lymphedema and wound breakdown.  She has been using local care, with marked  improvement.  Her main concern now is returning to work and prevention of pregnancy in the future.  She is ambulatory and has no major problems otherwise.  HISTORY OF PRESENT ILLNESS:  The patient had a warty lesion of the external genitalia, gradually enlarging over a 15-year period.  She had squamous cell carcinoma and had an indwelling IUD, with Pap smear suggesting actinomycosis. n December 20, 1999, she underwent a radical vulvectomy with a Martius flap reconstruction of the perineum and bilateral groin node dissection.  She had a focal area of wound breakdown of the left vulva, with separation of the left vulvar incision.  PAST MEDICAL HISTORY: 1. Significant for peptic ulcer disease. 2. Cesarean section.  CURRENT MEDICATIONS:  Prevacid.  ALLERGIES:  No known drug allergies.  PERSONAL/SOCIAL HISTORY:  The patient has a 25-pack-year-history of tobacco use, but quit in  April.  REVIEW OF SYSTEMS:  As above.  PHYSICAL EXAMINATION:  VITAL SIGNS:  Stable and afebrile.  NODES:  There is no pathologic lymphadenopathy.  EXTREMITIES:  The groins bilaterally have well-healed incisions.  There is trace pretibial edema.  GENITOURINARY:  External genitalia have complete  healing of the radical vulvectomy incision with the exception of a 1.0 cm area that has yet to epithelialize in the left posterior perineal region.  There are no lesions suggesting recurrent vulvar cancer.  ASSESSMENT: 1. Vulvar carcinoma, completing convalescence from a radical vulvectomy. 2. Lymphedema.  PLAN:  Aldactazide 50/50 can be used one to two times daily for her lymphedema; a prescription is given.  Since the patient wishes contraception, she was given Depo-Provera 150 mg.  I recommended that she and her husband discuss permanent sterilization.  She will return for a followup in three months, including cytology. DD:  02/29/00 TD:  02/29/00 Job: [id number]

## 2011-01-20 NOTE — Consult Note (Signed)
NAME:  [name] [name], [name] [name]                            ACCOUNT NO.:  [id number]   MEDICAL RECORD NO.:  [id number]                   PATIENT TYPE:  OUT   LOCATION:  GYN                                  FACILITY:  [facility]   PHYSICIAN:  De Blanch, M.D.         DATE OF BIRTH:  1956/05/11   DATE OF CONSULTATION:  05/20/2003  DATE OF DISCHARGE:                                   CONSULTATION   A 55 year old [place] female returns for continuing follow-up of vulvar  cancer.   INTERVAL HISTORY:  Since her last visit the patient has had no new vulvar or  gynecologic symptoms.  She has used Depo-Provera for contraception,  receiving it at the [facility].   Her biggest complaint is that she is having pain in her right ankle.  This  started in April and she was seen in the emergency room, but has had no  further evaluation since then.   She has no GI or GU symptoms.  Her functional status is otherwise good.   HISTORY OF PRESENT ILLNESS:  The patient underwent a modified radical  vulvectomy and bilateral inguinal lymphadenectomy April 2001 for an  advanced, but superficially invasive squamous cell carcinoma.  Her lymph  nodes were negative.  She has had some mild lymphedema in her right lower  extremity.   PAST MEDICAL HISTORY:  1. Peptic ulcer disease.  2. Cesarean section.  3. Chronic lymphedema.   PAST SURGICAL HISTORY:  As above.   CURRENT MEDICATIONS:  1. Prevacid.  2. Depo-Provera.  3. Aldactazide.   ALLERGIES:  None.   SOCIAL HISTORY:  The patient is medically indigent.   FAMILY HISTORY:  Unchanged from previous notations.   SOCIAL HISTORY:  Unchanged from previous notations.   REVIEW OF SYSTEMS:  Negative except as noted above.   PHYSICAL EXAMINATION:  VITAL SIGNS:  Weight 233 pounds, blood pressure  128/80.  GENERAL:  The patient is a healthy [place] female in no acute distress.  HEENT:  Negative.  NECK:  Supple without thyromegaly.  LYMPH:  There is no  supraclavicular or inguinal adenopathy.  SKIN:  All of her inguinal incisions are healed well.  She has a well healed  Pfannenstiel incision as well.  PELVIC:  EGBUS has scarring from prior surgery with an incision that goes  lateral onto the buttocks.  No lesions are noted.  Introitus is normal.  No  lesions are noted in the vagina.  Cervix is also normal.  Uterus is  anterior, normal shape, size, consistency.  There are no adnexal masses  noted.  Rectovaginal examination confirms.   IMPRESSION:  Stage II vulvar cancer.  No evidence of recurrent disease.   Mild lymphedema of the right lower extremity.   Pain in the right ankle of questionable etiology.  Given that she has a  relatively small amount of lymphedema, I do not think that this is the  etiology for her ankle pain.  I have encouraged her to contact her primary  care physician at [facility] who we would suggest  evaluate this problem.  The patient will be seen in the [facility] in six months for a check-up and return six months thereafter to see  [place] (the patient formerly has seen Laqueta Linden, M.D. but because of  financial problems cannot see Laqueta Linden, M.D. in her office anymore).                                               De Blanch, M.D.    DC/MEDQ  D:  05/20/2003  T:  05/20/2003  Job:  [id number]   cc:   Laqueta Linden, M.D.  [address]  [place]  [place] [address]  Fax: [phone]   Telford Nab, R.N.  [address]  [place], [place] [address]   Rema Fendt   Everardo All. Madilyn Fireman, M.D.  [address]  [place]  [place] [address]  Fax: [phone]

## 2012-06-12 ENCOUNTER — Encounter: Payer: Self-pay | Admitting: Cardiology

## 2013-02-11 DIAGNOSIS — Z5181 Encounter for therapeutic drug level monitoring: Secondary | ICD-10-CM | POA: Insufficient documentation

## 2013-02-11 DIAGNOSIS — K219 Gastro-esophageal reflux disease without esophagitis: Secondary | ICD-10-CM | POA: Insufficient documentation

## 2013-02-11 DIAGNOSIS — F419 Anxiety disorder, unspecified: Secondary | ICD-10-CM | POA: Insufficient documentation

## 2013-06-26 ENCOUNTER — Encounter: Payer: Self-pay | Admitting: Cardiology

## 2013-07-07 ENCOUNTER — Encounter: Payer: Self-pay | Admitting: Cardiovascular Disease

## 2013-07-07 ENCOUNTER — Encounter: Payer: Self-pay | Admitting: Cardiology

## 2013-07-10 ENCOUNTER — Ambulatory Visit (INDEPENDENT_AMBULATORY_CARE_PROVIDER_SITE_OTHER): Payer: Medicaid Other | Admitting: Cardiology

## 2013-07-10 ENCOUNTER — Encounter (INDEPENDENT_AMBULATORY_CARE_PROVIDER_SITE_OTHER): Payer: Self-pay

## 2013-07-10 ENCOUNTER — Encounter: Payer: Self-pay | Admitting: Cardiology

## 2013-07-10 VITALS — BP 180/98 | HR 84 | Ht 66.0 in | Wt 334.0 lb

## 2013-07-10 DIAGNOSIS — I509 Heart failure, unspecified: Secondary | ICD-10-CM

## 2013-07-10 MED ORDER — CARVEDILOL 12.5 MG PO TABS: 12.5000 mg | ORAL_TABLET | Freq: Two times a day (BID) | ORAL | Status: DC

## 2013-07-10 NOTE — Progress Notes (Signed)
Patient ID: [name] [name], female   DOB: [birth year], 57 y.o.   MRN: [id number]    Patient Name: [name] [name] Date of Encounter: 07/10/2013  Primary Care Provider:  [facility] Primary Cardiologist:  Tobias Alexander, H   Patient Profile Hypertension, DOE  Problem List   No past medical history on file. No past surgical history on file.  Allergies  Allergies  Allergen Reactions  . Penicillins     REACTION: Unknown reaction    HPI  57 year old patient with h/o HTN, squamous cell cancer of the vulva, s/p resection, currently in remission. The patient is coming complaining of dyspnea on exertion. The patient states that ever since she had her surgery and lymph node removal from both of her groins she developed significant lower extremity edema, became less active and gained a lot of weight. The patient states that she has no disease in the last year worsening dyspnea on exertion, or doing house chores, walking couple flight of stairs, she denies chest pain, palpitations or syncope. She states that her lower extremity edema is trying since her surgery doubling her furosemide from 40-80 mg daily improved gait in the last couple of weeks.  Home Medications  Prior to Admission medications   Medication Sig Start Date End Date Taking? Authorizing Provider  amitriptyline (ELAVIL) 10 MG tablet Take 10 mg by mouth at bedtime.   Yes Historical Provider, MD  furosemide (LASIX) 40 MG tablet Take 40 mg by mouth.   Yes Historical Provider, MD  lansoprazole (PREVACID) 15 MG capsule Take 15 mg by mouth daily at 12 noon.   Yes Historical Provider, MD  lisinopril (PRINIVIL,ZESTRIL) 20 MG tablet Take 20 mg by mouth daily.   Yes Historical Provider, MD  loratadine (CLARITIN) 10 MG tablet Take 10 mg by mouth daily.   Yes Historical Provider, MD  meloxicam (MOBIC) 15 MG tablet Take 15 mg by mouth daily.   Yes Historical Provider, MD  Multiple Vitamin (MULTIVITAMIN) capsule Take 1 capsule by mouth  daily.   Yes Historical Provider, MD  pravastatin (PRAVACHOL) 20 MG tablet Take 20 mg by mouth daily.   Yes Historical Provider, MD  traMADol (ULTRAM) 50 MG tablet Take by mouth every 6 (six) hours as needed.   Yes Historical Provider, MD    Family History  No family history on file.  Social History  History   Social History  . Marital Status: Married    Spouse Name: N/A    Number of Children: N/A  . Years of Education: N/A   Occupational History  . Not on file.   Social History Main Topics  . Smoking status: Former Games developer  . Smokeless tobacco: Not on file  . Alcohol Use: No  . Drug Use: No  . Sexual Activity: Not on file   Other Topics Concern  . Not on file   Social History Narrative  . No narrative on file     Review of Systems General:  No chills, fever, night sweats or weight changes.  Cardiovascular:  No chest pain, dyspnea on exertion, edema, orthopnea, palpitations, paroxysmal nocturnal dyspnea. Dermatological: No rash, lesions/masses Respiratory: No cough, dyspnea Urologic: No hematuria, dysuria Abdominal:   No nausea, vomiting, diarrhea, bright red blood per rectum, melena, or hematemesis Neurologic:  No visual changes, wkns, changes in mental status. All other systems reviewed and are otherwise negative except as noted above.  Physical Exam  Blood pressure 180/98, pulse 84, height 5\' 6"  (1.676 m), weight 334 lb (  151.501 kg).  General: Pleasant, NAD, morbidly obese Psych: Normal affect. Neuro: Alert and oriented X 3. Moves all extremities spontaneously. HEENT: Normal  Neck: Supple without bruits or JVD. Lungs:  Resp regular and unlabored, CTA. Heart: RRR no s3, s4, or murmurs. Abdomen: Soft, non-tender, non-distended, BS + x 4.  Extremities: No clubbing, cyanosis, severe B/L lymphedema. DP/PT/Radials 2+ and equal bilaterally.  Accessory Clinical Findings  ECG - sinus rhythm, 84 beats per minute, normal EKG   Assessment & Plan  57 year old  female with h/o vulvar squamous cell cancer  1. DOE, LE edema - the edema is chronic and has contribution of lympedema post lymphnode resection. She improved on Lasix. But still has significant edema. We will check echocardiogram to evaluate for systolic and diastolic function.  We will obtain labs from her PCP that were drawn few days ago and chenge meds based on lytes and Crea.  2. Hypertension - uncontrolled, we will add coreg 12.5 mg po bid and follow.  3. Lipids - labs from PCP  Follow up in 1 month  Tobias Alexander, Rexene Edison, MD 07/10/2013, 9:55 AM

## 2013-07-10 NOTE — Patient Instructions (Addendum)
**Note De-Identified [name] [name] Obfuscation** Your physician has requested that you have an echocardiogram. Echocardiography is a painless test that uses sound waves to create images of your heart. It provides your doctor with information about the size and shape of your heart and how well your heart's chambers and valves are working. This procedure takes approximately one hour. There are no restrictions for this procedure.  Your physician has recommended you make the following change in your medication: start taking Carvedilol 12.5 mg twice daily  Your physician recommends that you schedule a follow-up appointment in: after Echo

## 2013-07-15 ENCOUNTER — Other Ambulatory Visit: Payer: Self-pay

## 2013-07-15 MED ORDER — METOLAZONE 2.5 MG PO TABS: 2.5000 mg | ORAL_TABLET | Freq: Every day | ORAL | Status: DC

## 2013-07-18 ENCOUNTER — Ambulatory Visit (HOSPITAL_COMMUNITY): Payer: Medicaid Other | Attending: Cardiology | Admitting: Radiology

## 2013-07-18 DIAGNOSIS — E785 Hyperlipidemia, unspecified: Secondary | ICD-10-CM | POA: Insufficient documentation

## 2013-07-18 DIAGNOSIS — Z6841 Body Mass Index (BMI) 40.0 and over, adult: Secondary | ICD-10-CM | POA: Insufficient documentation

## 2013-07-18 DIAGNOSIS — R609 Edema, unspecified: Secondary | ICD-10-CM

## 2013-07-18 DIAGNOSIS — I509 Heart failure, unspecified: Secondary | ICD-10-CM

## 2013-07-18 DIAGNOSIS — R0609 Other forms of dyspnea: Secondary | ICD-10-CM | POA: Insufficient documentation

## 2013-07-18 DIAGNOSIS — I10 Essential (primary) hypertension: Secondary | ICD-10-CM | POA: Insufficient documentation

## 2013-07-18 DIAGNOSIS — R0989 Other specified symptoms and signs involving the circulatory and respiratory systems: Secondary | ICD-10-CM | POA: Insufficient documentation

## 2013-07-18 NOTE — Progress Notes (Signed)
Echocardiogram performed.  

## 2013-07-22 ENCOUNTER — Ambulatory Visit: Payer: Medicaid Other | Admitting: Cardiology

## 2013-07-23 NOTE — Progress Notes (Signed)
Patient ID: [name] [name], female   DOB: October 21, 1955, 57 y.o.   MRN: [id number]  The patient was found to have pseudonormal pattern of diastolic dysfunction with elevated filling pressures. She also has mild pulmonary hypertension. Her BP was significantly elevated at the last visit and she was started only on a small dose of Metoprolol 12.5 mg BID. We will start Amlodipine 2.5 mg PO daily.  Tobias Alexander, H 07/23/2013

## 2013-07-25 NOTE — Progress Notes (Signed)
**Note De-Identified [name] [name] Obfuscation** Pt states that she wants to wait until F/u with Dr Delton See on 11/25 to discuss her taking another BP medication. Will forward note to Dr Delton See as Lorain Childes.

## 2013-07-28 ENCOUNTER — Encounter: Payer: Self-pay | Admitting: *Deleted

## 2013-07-29 ENCOUNTER — Encounter: Payer: Self-pay | Admitting: Cardiology

## 2013-07-29 ENCOUNTER — Ambulatory Visit (INDEPENDENT_AMBULATORY_CARE_PROVIDER_SITE_OTHER): Payer: Medicaid Other | Admitting: Cardiology

## 2013-07-29 VITALS — BP 138/90 | HR 69 | Ht 66.0 in | Wt 322.0 lb

## 2013-07-29 DIAGNOSIS — I5032 Chronic diastolic (congestive) heart failure: Secondary | ICD-10-CM

## 2013-07-29 DIAGNOSIS — I10 Essential (primary) hypertension: Secondary | ICD-10-CM

## 2013-07-29 LAB — COMPREHENSIVE METABOLIC PANEL
ALT: 14 U/L (ref 0–35)
AST: 15 U/L (ref 0–37)
Albumin: 3.9 g/dL (ref 3.5–5.2)
Alkaline Phosphatase: 72 U/L (ref 39–117)
BUN: 46 mg/dL — ABNORMAL HIGH (ref 6–23)
CO2: 33 mEq/L — ABNORMAL HIGH (ref 19–32)
Calcium: 9.9 mg/dL (ref 8.4–10.5)
Chloride: 96 mEq/L (ref 96–112)
Creatinine, Ser: 1.6 mg/dL — ABNORMAL HIGH (ref 0.4–1.2)
GFR: 36.12 mL/min — ABNORMAL LOW (ref >60.00)
Glucose, Bld: 110 mg/dL — ABNORMAL HIGH (ref 70–99)
Potassium: 5 mEq/L (ref 3.5–5.1)
Sodium: 137 mEq/L (ref 135–145)
Total Bilirubin: 0.4 mg/dL (ref 0.3–1.2)
Total Protein: 8 g/dL (ref 6.0–8.3)

## 2013-07-29 LAB — CBC WITH DIFFERENTIAL/PLATELET
Basophils Absolute: 0 10*3/uL (ref 0.0–0.1)
Basophils Relative: 0.5 % (ref 0.0–3.0)
Eosinophils Absolute: 0.1 10*3/uL (ref 0.0–0.7)
Eosinophils Relative: 1 % (ref 0.0–5.0)
HCT: 42.9 % (ref 36.0–46.0)
Hemoglobin: 13.9 g/dL (ref 12.0–15.0)
Lymphocytes Relative: 42.2 % (ref 12.0–46.0)
Lymphs Abs: 2.5 10*3/uL (ref 0.7–4.0)
MCHC: 32.5 g/dL (ref 30.0–36.0)
MCV: 84.3 fl (ref 78.0–100.0)
Monocytes Absolute: 0.5 10*3/uL (ref 0.1–1.0)
Monocytes Relative: 7.8 % (ref 3.0–12.0)
Neutro Abs: 2.8 10*3/uL (ref 1.4–7.7)
Neutrophils Relative %: 48.5 % (ref 43.0–77.0)
Platelets: 299 10*3/uL (ref 150.0–400.0)
RBC: 5.09 Mil/uL (ref 3.87–5.11)
RDW: 13.7 % (ref 11.5–14.6)
WBC: 5.8 10*3/uL (ref 4.5–10.5)

## 2013-07-29 LAB — TSH: TSH: 1.19 u[IU]/mL (ref 0.35–5.50)

## 2013-07-29 NOTE — Progress Notes (Signed)
Patient ID: [name], female   DOB: 20-Jan-1956, 57 y.o.   MRN: [id number]    Patient Name: [name] [name] Date of Encounter: 07/29/2013  Primary Care Provider:  Lilia [name] Primary Cardiologist:  [name] [name], H   Patient Profile Hypertension, DOE  Problem List   Past Medical History  Diagnosis Date  . Essential hypertension, benign   . Other and unspecified hyperlipidemia   . Other malignant neoplasm without specification of site   . Polyp of nasal cavity   . Osteoarthrosis, unspecified whether generalized or localized, unspecified site   . Rheumatoid arthritis(714.0)   . Urinary frequency   . Absence of menstruation   . Aseptic necrosis of bone, site unspecified     bilateral tallus  . Malignant neoplasm of vulva, unspecified site     s/p vulectomy  . Carpal tunnel syndrome     bilateral  . Esophageal reflux   . DOE (dyspnea on exertion)   . Lymphedema   . Cancer of skin, squamous cell     vulva   Past Surgical History  Procedure Laterality Date  . Radical vulvectomy    . Lymphadenectomy Bilateral     inguinal   . Cesarean section    . Colonoscopy w/ polypectomy      negative polyps    Allergies  Allergies  Allergen Reactions  . Penicillins     REACTION: Unknown reaction    HPI  57 year old patient with h/o HTN, squamous cell cancer of the vulva, s/p resection, currently in remission. The patient is coming complaining of dyspnea on exertion. The patient states that ever since she had her surgery and lymph node removal from both of her groins she developed significant lower extremity edema, became less active and gained a lot of weight. The patient states that she has no disease in the last year worsening dyspnea on exertion, or doing house chores, walking couple flight of stairs, she denies chest pain, palpitations or syncope. She states that her lower extremity edema is trying since her surgery doubling her furosemide from 40-80 mg daily  improved gait in the last couple of weeks.  The patient is coming after one month's and states that her lower extremity edema has improved significantly, as well as her shortness of breath. She feels overall much better, and she lost 12 pounds since the last visit.  Home Medications  Prior to Admission medications   Medication Sig Start Date End Date Taking? Authorizing Provider  amitriptyline (ELAVIL) 10 MG tablet Take 10 mg by mouth at bedtime.   Yes Historical Provider, MD  furosemide (LASIX) 40 MG tablet Take 40 mg by mouth.   Yes Historical Provider, MD  lansoprazole (PREVACID) 15 MG capsule Take 15 mg by mouth daily at 12 noon.   Yes Historical Provider, MD  lisinopril (PRINIVIL,ZESTRIL) 20 MG tablet Take 20 mg by mouth daily.   Yes Historical Provider, MD  loratadine (CLARITIN) 10 MG tablet Take 10 mg by mouth daily.   Yes Historical Provider, MD  meloxicam (MOBIC) 15 MG tablet Take 15 mg by mouth daily.   Yes Historical Provider, MD  Multiple Vitamin (MULTIVITAMIN) capsule Take 1 capsule by mouth daily.   Yes Historical Provider, MD  pravastatin (PRAVACHOL) 20 MG tablet Take 20 mg by mouth daily.   Yes Historical Provider, MD  traMADol (ULTRAM) 50 MG tablet Take by mouth every 6 (six) hours as needed.   Yes Historical Provider, MD    Family History  Family History  Problem Relation Age of Onset  . Hypertension      family history  . Rheum arthritis      family history  . Stroke      family history    Social History  History   Social History  . Marital Status: Married    Spouse Name: N/A    Number of Children: N/A  . Years of Education: N/A   Occupational History  . Not on file.   Social History Main Topics  . Smoking status: Former Games developer  . Smokeless tobacco: Not on file  . Alcohol Use: No  . Drug Use: No  . Sexual Activity: Not on file   Other Topics Concern  . Not on file   Social History Narrative  . No narrative on file     Review of  Systems General:  No chills, fever, night sweats or weight changes.  Cardiovascular:  No chest pain, dyspnea on exertion, edema, orthopnea, palpitations, paroxysmal nocturnal dyspnea. Dermatological: No rash, lesions/masses Respiratory: No cough, dyspnea Urologic: No hematuria, dysuria Abdominal:   No nausea, vomiting, diarrhea, bright red blood per rectum, melena, or hematemesis Neurologic:  No visual changes, wkns, changes in mental status. All other systems reviewed and are otherwise negative except as noted above.  Physical Exam  Blood pressure 138/90, pulse 69, weight 322 lb (146.058 kg), SpO2 91.00%.  General: Pleasant, NAD, morbidly obese Psych: Normal affect. Neuro: Alert and oriented X 3. Moves all extremities spontaneously. HEENT: Normal  Neck: Supple without bruits or JVD. Lungs:  Resp regular and unlabored, CTA. Heart: RRR no s3, s4, or murmurs. Abdomen: Soft, non-tender, non-distended, BS + x 4.  Extremities: No clubbing, cyanosis, moderate B/L lymphedema. DP/PT/Radials 2+ and equal bilaterally.  Accessory Clinical Findings  ECG - sinus rhythm, 84 beats per minute, normal EKG   Assessment & Plan  57 year old female with h/o vulvar squamous cell cancer  1. chronic diastolic congestive heart failure - with DOE, LE edema - the edema is chronic and has contribution of lympedema post lymphnode resection. However the  Patient lost 12 pounds after oral Lasix.she still had edema but significantly improved. The goal is to continue diuresis, we will check labs today for lites and creatinine and we'll adjust appropriately.   Echocardiogram showed pseudonormal pattern of diastolic dysfunction with elevated filling pressures. She also has mild pulmonary hypertension. Her BP was significantly elevated at the last visit, it is now normal after initiation of metoprolol and amlodipine.   2. Hypertension - controlled, continue current regimen  3. Lipids - labs from PCP  Follow up  in 2 month  [name] [name], Rexene Edison, MD 07/29/2013, 8:30 AM

## 2013-07-29 NOTE — Patient Instructions (Addendum)
Your physician recommends that you schedule a follow-up appointment in: 2 months with Dr Delton See  Your physician recommends that you have lab work today ( TSH, BMP,CBC)

## 2013-07-30 ENCOUNTER — Other Ambulatory Visit: Payer: Self-pay

## 2013-07-30 DIAGNOSIS — I5032 Chronic diastolic (congestive) heart failure: Secondary | ICD-10-CM

## 2013-08-13 ENCOUNTER — Other Ambulatory Visit (INDEPENDENT_AMBULATORY_CARE_PROVIDER_SITE_OTHER): Payer: Medicaid Other

## 2013-08-13 DIAGNOSIS — I5032 Chronic diastolic (congestive) heart failure: Secondary | ICD-10-CM

## 2013-08-13 LAB — BASIC METABOLIC PANEL
BUN: 26 mg/dL — ABNORMAL HIGH (ref 6–23)
CO2: 31 mEq/L (ref 19–32)
Calcium: 9.4 mg/dL (ref 8.4–10.5)
Chloride: 100 mEq/L (ref 96–112)
Creatinine, Ser: 1.3 mg/dL — ABNORMAL HIGH (ref 0.4–1.2)
GFR: 44.9 mL/min — ABNORMAL LOW (ref >60.00)
Glucose, Bld: 116 mg/dL — ABNORMAL HIGH (ref 70–99)
Potassium: 5 mEq/L (ref 3.5–5.1)
Sodium: 138 mEq/L (ref 135–145)

## 2013-08-18 ENCOUNTER — Telehealth: Payer: Self-pay | Admitting: Cardiology

## 2013-08-18 DIAGNOSIS — I10 Essential (primary) hypertension: Secondary | ICD-10-CM

## 2013-08-18 NOTE — Telephone Encounter (Signed)
New Problem:  Pt is requesting the nurse call her with her recent blood work results.

## 2013-08-18 NOTE — Telephone Encounter (Signed)
Patient called requesting lab results.  I reviewed results with patient who verbalized understanding.  Patient asked if she could go back on her Lasix since her BUN and creatinine improved - patient states she thinks she needs to take Lasix at least once daily.  Patient denies complaints; just states she thinks she needs it to maintain health.  I advised patient that I will send message to Dr. Delton See for advice; Dr. Delton See will return to the office tomorrow.  Patient verbalized understanding and agreement.

## 2013-08-19 NOTE — Telephone Encounter (Signed)
Yes, she can be restarted on Lasix 40 mg po daily. We will need to chcek her labs prior to the next visit that is scheduled for September 26, 2012. Could you please prescribe and call her? Thank you, Aris Lot

## 2013-08-19 NOTE — Telephone Encounter (Signed)
Returned call to patient Dr.Nelson advised to restart Lasix 40 mg daily.Advised to check bmet before next office visit 09/26/13.Patient stated she lives out of town and prefers to have bmet at office visit.Advised to call back sooner if needed.

## 2013-09-26 ENCOUNTER — Encounter: Payer: Self-pay | Admitting: Cardiology

## 2013-09-26 ENCOUNTER — Ambulatory Visit (INDEPENDENT_AMBULATORY_CARE_PROVIDER_SITE_OTHER): Payer: Medicaid Other | Admitting: Cardiology

## 2013-09-26 VITALS — BP 150/90 | HR 67 | Ht 64.0 in

## 2013-09-26 DIAGNOSIS — I10 Essential (primary) hypertension: Secondary | ICD-10-CM

## 2013-09-26 LAB — BASIC METABOLIC PANEL
BUN: 35 mg/dL — ABNORMAL HIGH (ref 6–23)
CO2: 32 mEq/L (ref 19–32)
Calcium: 9.7 mg/dL (ref 8.4–10.5)
Chloride: 99 mEq/L (ref 96–112)
Creatinine, Ser: 1.4 mg/dL — ABNORMAL HIGH (ref 0.4–1.2)
GFR: 40.2 mL/min — ABNORMAL LOW (ref >60.00)
Glucose, Bld: 100 mg/dL — ABNORMAL HIGH (ref 70–99)
Potassium: 5.2 mEq/L — ABNORMAL HIGH (ref 3.5–5.1)
Sodium: 138 mEq/L (ref 135–145)

## 2013-09-26 MED ORDER — CARVEDILOL 25 MG PO TABS: 25.0000 mg | ORAL_TABLET | Freq: Two times a day (BID) | ORAL | Status: DC

## 2013-09-26 NOTE — Patient Instructions (Signed)
**Note De-Identified Althia Egolf Obfuscation** Your physician has recommended you make the following change in your medication: increase Carvedilol to 25 mg twice daily  Your physician recommends that you return for lab work in: today  Your physician wants you to follow-up in: 3 months. You will receive a reminder letter in the mail two months in advance. If you don't receive a letter, please call our office to schedule the follow-up appointment.

## 2013-09-26 NOTE — Progress Notes (Signed)
Patient ID: WINIFRED BODIFORD, female   DOB: 1955/11/05, 58 y.o.   MRN: [id number]    Patient Name: [name] [name] Date of Encounter: 09/26/2013  Primary Care Provider:  Tula Nakayama Primary Cardiologist:  Ena Dawley, H   Patient Profile Hypertension, DOE  Problem List   Past Medical History  Diagnosis Date  . Essential hypertension, benign   . Other and unspecified hyperlipidemia   . Other malignant neoplasm without specification of site   . Polyp of nasal cavity   . Osteoarthrosis, unspecified whether generalized or localized, unspecified site   . Rheumatoid arthritis(714.0)   . Urinary frequency   . Absence of menstruation   . Aseptic necrosis of bone, site unspecified     bilateral tallus  . Malignant neoplasm of vulva, unspecified site     s/p vulectomy  . Carpal tunnel syndrome     bilateral  . Esophageal reflux   . DOE (dyspnea on exertion)   . Lymphedema   . Cancer of skin, squamous cell     vulva   Past Surgical History  Procedure Laterality Date  . Radical vulvectomy    . Lymphadenectomy Bilateral     inguinal   . Cesarean section    . Colonoscopy w/ polypectomy      negative polyps    Allergies  Allergies  Allergen Reactions  . Penicillins     REACTION: Unknown reaction    HPI  58 year old patient with h/o HTN, squamous cell cancer of the vulva, s/p resection, currently in remission. The patient is coming complaining of dyspnea on exertion. The patient states that ever since she had her surgery and lymph node removal from both of her groins she developed significant lower extremity edema, became less active and gained a lot of weight. The patient states that she has no disease in the last year worsening dyspnea on exertion, or doing house chores, walking couple flight of stairs, she denies chest pain, palpitations or syncope. She states that her lower extremity edema is trying since her surgery doubling her furosemide from 40-80 mg daily  improved gait in the last couple of weeks.  The patient is coming after two months, we had to hold lasix for a while as her Crea has increased (1.6) and then restart when Crea back to 1.3. Currently on Lasix 40 mg daily and metolazone 2.5 mg daily. She refused to be weighted today but states that overall she lost weight. (after the first month on lasix 12 lbs). She is asking about weight loss pills and approach to loosing weight.  Home Medications  Prior to Admission medications   Medication Sig Start Date End Date Taking? Authorizing Provider  amitriptyline (ELAVIL) 10 MG tablet Take 10 mg by mouth at bedtime.   Yes Historical Provider, MD  furosemide (LASIX) 40 MG tablet Take 40 mg by mouth.   Yes Historical Provider, MD  lansoprazole (PREVACID) 15 MG capsule Take 15 mg by mouth daily at 12 noon.   Yes Historical Provider, MD  lisinopril (PRINIVIL,ZESTRIL) 20 MG tablet Take 20 mg by mouth daily.   Yes Historical Provider, MD  loratadine (CLARITIN) 10 MG tablet Take 10 mg by mouth daily.   Yes Historical Provider, MD  meloxicam (MOBIC) 15 MG tablet Take 15 mg by mouth daily.   Yes Historical Provider, MD  Multiple Vitamin (MULTIVITAMIN) capsule Take 1 capsule by mouth daily.   Yes Historical Provider, MD  pravastatin (PRAVACHOL) 20 MG tablet Take 20 mg by  mouth daily.   Yes Historical Provider, MD  traMADol (ULTRAM) 50 MG tablet Take by mouth every 6 (six) hours as needed.   Yes Historical Provider, MD    Family History  Family History  Problem Relation Age of Onset  . Hypertension      family history  . Rheum arthritis      family history  . Stroke      family history    Social History  History   Social History  . Marital Status: Married    Spouse Name: N/A    Number of Children: N/A  . Years of Education: N/A   Occupational History  . Not on file.   Social History Main Topics  . Smoking status: Former Research scientist (life sciences)  . Smokeless tobacco: Not on file  . Alcohol Use: No  .  Drug Use: No  . Sexual Activity: Not on file   Other Topics Concern  . Not on file   Social History Narrative  . No narrative on file     Review of Systems General:  No chills, fever, night sweats or weight changes.  Cardiovascular:  No chest pain, dyspnea on exertion, edema, orthopnea, palpitations, paroxysmal nocturnal dyspnea. Dermatological: No rash, lesions/masses Respiratory: No cough, dyspnea Urologic: No hematuria, dysuria Abdominal:   No nausea, vomiting, diarrhea, bright red blood per rectum, melena, or hematemesis Neurologic:  No visual changes, wkns, changes in mental status. All other systems reviewed and are otherwise negative except as noted above.  Physical Exam  Blood pressure 150/90, pulse 67, height 5\' 4"  (1.626 m).  General: Pleasant, NAD, morbidly obese Psych: Normal affect. Neuro: Alert and oriented X 3. Moves all extremities spontaneously. HEENT: Normal  Neck: Supple without bruits or JVD. Lungs:  Resp regular and unlabored, CTA. Heart: RRR no s3, s4, or murmurs. Abdomen: Soft, non-tender, non-distended, BS + x 4.  Extremities: No clubbing, cyanosis, moderate B/L lymphedema. DP/PT/Radials 2+ and equal bilaterally.  Accessory Clinical Findings  ECG - sinus rhythm, 84 beats per minute, normal EKG   Assessment & Plan  58 year old female with h/o vulvar squamous cell cancer  1. Chronic diastolic congestive heart failure - with DOE, LE edema - the edema is chronic and has contribution of lympedema post lymphnode resection. However the  Patient lost 12 pounds after the first month oral Lasix then plateaoud. She still has edema that is probably chronic but significantly improved SOB. The goal is to continue diuresis with the same regimen, we will check labs today for lites and creatinine and we'll adjust appropriately.   2. Hypertension - uncontrolled, Echocardiogram showed pseudonormal pattern of diastolic dysfunction with elevated filling pressures. We  will continue diuresis and increase carvedilol to 25 mg po bid.   3. Obesity - the patient is very sedentary, currently not doing any activity. She was counseled about proper exercise and encouraged to walk daily that would help to mobilize fluids from her legs as well. We didn't recommend to use weight loss pills. We will refer her to the bariatric center in [place] where she lives.  4. Lipids - labs from PCP  Follow up in 3 month. BMP today.  Ena Dawley, Lemmie Evens, MD 09/26/2013, 8:40 AM

## 2013-09-30 ENCOUNTER — Telehealth: Payer: Self-pay | Admitting: Cardiology

## 2013-09-30 DIAGNOSIS — I10 Essential (primary) hypertension: Secondary | ICD-10-CM | POA: Insufficient documentation

## 2013-09-30 DIAGNOSIS — M797 Fibromyalgia: Secondary | ICD-10-CM | POA: Insufficient documentation

## 2013-09-30 DIAGNOSIS — M25569 Pain in unspecified knee: Secondary | ICD-10-CM | POA: Insufficient documentation

## 2013-09-30 DIAGNOSIS — E669 Obesity, unspecified: Secondary | ICD-10-CM | POA: Insufficient documentation

## 2013-09-30 DIAGNOSIS — E875 Hyperkalemia: Secondary | ICD-10-CM

## 2013-09-30 DIAGNOSIS — M1990 Unspecified osteoarthritis, unspecified site: Secondary | ICD-10-CM | POA: Insufficient documentation

## 2013-09-30 DIAGNOSIS — M109 Gout, unspecified: Secondary | ICD-10-CM | POA: Insufficient documentation

## 2013-09-30 NOTE — Telephone Encounter (Signed)
Lm on machine to call back.

## 2013-09-30 NOTE — Telephone Encounter (Signed)
New Problem:  Pt is calling to see if Dr. Meda Coffee will give her a referral to [facility] [phone] (Phone). Pt would like a call back to know if that is possible.

## 2013-09-30 NOTE — Telephone Encounter (Signed)
Returned call.  Notified of her lab results. Will repeat BMET in 3 wk. 2/17.  She states her PCP put her on Allopurinol for her gout.  She wanted Dr. Meda Coffee to be aware.  Also requesting a referral to [facility].  Advised that Dr. Meda Coffee not in office today but will forward message to her to see if she would do referral.

## 2013-10-03 ENCOUNTER — Other Ambulatory Visit: Payer: Self-pay

## 2013-10-03 NOTE — Telephone Encounter (Signed)
LMTCB. We do not refer pts to a Bariatric MD at this office. We advise our pts to call [phone] to be established in a weight loss clinic at [facility]. The pt was given a card to call at her last OV with Dr Meda Coffee.

## 2013-10-03 NOTE — Telephone Encounter (Signed)
**Note De-Identified Liston Thum Obfuscation** The pt wants to see a [facility] Bariatric MD. The pt is advised to call [facility] for appt. She states that she needs a referral so I advised her to contact her PCP to discuss seeing a Bariatric MD, She verbalized understanding.

## 2013-10-16 ENCOUNTER — Telehealth: Payer: Self-pay | Admitting: Cardiology

## 2013-10-16 NOTE — Telephone Encounter (Signed)
New problem   Pt need to speak to nurse concerning canceling her lab appt. Please call pt.

## 2013-10-16 NOTE — Telephone Encounter (Signed)
**Note De-Identified [name] [name] Obfuscation** The pt states that she cannot come to office on 2/17 for lab work as she cant afford gas and she lives in [place]. She request that she has labs drawn at her [facility] in April. Appt scheduled for 4/22, pt aware.

## 2013-10-21 ENCOUNTER — Other Ambulatory Visit: Payer: Medicaid Other

## 2013-11-07 ENCOUNTER — Other Ambulatory Visit: Payer: Self-pay | Admitting: Cardiology

## 2013-12-04 ENCOUNTER — Encounter: Payer: Self-pay | Admitting: Cardiology

## 2013-12-10 ENCOUNTER — Other Ambulatory Visit: Payer: Self-pay | Admitting: Cardiology

## 2013-12-24 ENCOUNTER — Other Ambulatory Visit: Payer: Medicaid Other

## 2013-12-24 ENCOUNTER — Ambulatory Visit: Payer: Medicaid Other | Admitting: Cardiology

## 2013-12-25 ENCOUNTER — Ambulatory Visit (INDEPENDENT_AMBULATORY_CARE_PROVIDER_SITE_OTHER): Payer: Medicaid Other | Admitting: Cardiology

## 2013-12-25 ENCOUNTER — Other Ambulatory Visit: Payer: Medicaid Other

## 2013-12-25 ENCOUNTER — Encounter: Payer: Self-pay | Admitting: Cardiology

## 2013-12-25 VITALS — BP 145/75 | HR 73 | Ht 64.0 in | Wt 338.0 lb

## 2013-12-25 DIAGNOSIS — I10 Essential (primary) hypertension: Secondary | ICD-10-CM

## 2013-12-25 DIAGNOSIS — R609 Edema, unspecified: Secondary | ICD-10-CM

## 2013-12-25 DIAGNOSIS — M8700 Idiopathic aseptic necrosis of unspecified bone: Secondary | ICD-10-CM

## 2013-12-25 DIAGNOSIS — I5032 Chronic diastolic (congestive) heart failure: Secondary | ICD-10-CM

## 2013-12-25 LAB — BASIC METABOLIC PANEL
BUN: 36 mg/dL — ABNORMAL HIGH (ref 6–23)
CO2: 31 mEq/L (ref 19–32)
Calcium: 9.8 mg/dL (ref 8.4–10.5)
Chloride: 99 mEq/L (ref 96–112)
Creatinine, Ser: 1.3 mg/dL — ABNORMAL HIGH (ref 0.4–1.2)
GFR: 44.84 mL/min — ABNORMAL LOW (ref >60.00)
Glucose, Bld: 120 mg/dL — ABNORMAL HIGH (ref 70–99)
Potassium: 5 mEq/L (ref 3.5–5.1)
Sodium: 138 mEq/L (ref 135–145)

## 2013-12-25 MED ORDER — AMLODIPINE BESYLATE 5 MG PO TABS
5.0000 mg | ORAL_TABLET | Freq: Every day | ORAL | Status: DC
Start: 2013-12-25 — End: 2014-05-01

## 2013-12-25 NOTE — Patient Instructions (Addendum)
STOP TAKING LISINOPRIL  START TAKING AMLODIPINE 5 MG DAILY   Your physician recommends that you schedule a follow-up appointment in: [place]  Your physician recommends that you return for lab work in: [place] (BMET)

## 2013-12-25 NOTE — Progress Notes (Signed)
Patient ID: ETOLA MULL, female   DOB: 02/16/1956, 58 y.o.   MRN: [id number]    Patient Name: [name] [name] Date of Encounter: 12/25/2013  Primary Care Provider:  Tula Nakayama Primary Cardiologist:  Dorothy Spark   Patient Profile Hypertension, DOE  Problem List   Past Medical History  Diagnosis Date  . Essential hypertension, benign   . Other and unspecified hyperlipidemia   . Other malignant neoplasm without specification of site   . Polyp of nasal cavity   . Osteoarthrosis, unspecified whether generalized or localized, unspecified site   . Rheumatoid arthritis(714.0)   . Urinary frequency   . Absence of menstruation   . Aseptic necrosis of bone, site unspecified     bilateral tallus  . Malignant neoplasm of vulva, unspecified site     s/p vulectomy  . Carpal tunnel syndrome     bilateral  . Esophageal reflux   . DOE (dyspnea on exertion)   . Lymphedema   . Cancer of skin, squamous cell     vulva   Past Surgical History  Procedure Laterality Date  . Radical vulvectomy    . Lymphadenectomy Bilateral     inguinal   . Cesarean section    . Colonoscopy w/ polypectomy      negative polyps    Allergies  Allergies  Allergen Reactions  . Penicillins     REACTION: Unknown reaction    HPI  58 year old patient with h/o HTN, squamous cell cancer of the vulva, s/p resection, currently in remission. The patient is coming complaining of dyspnea on exertion. The patient states that ever since she had her surgery and lymph node removal from both of her groins she developed significant lower extremity edema, became less active and gained a lot of weight. The patient states that she has no disease in the last year worsening dyspnea on exertion, or doing house chores, walking couple flight of stairs, she denies chest pain, palpitations or syncope. She states that her lower extremity edema is trying since her surgery doubling her furosemide from 40-80 mg daily improved  gait in the last couple of weeks.  The patient is coming after two months, we had to hold lasix for a while as her Crea has increased (1.6) and then restart when Crea back to 1.3. Currently on Lasix 40 mg daily and metolazone 2.5 mg daily. She refused to be weighted today but states that overall she lost weight. (after the first month on lasix 12 lbs). She is asking about weight loss pills and approach to loosing weight.  The patient is scheduled for a knee sx on 01/06/2014, she needs preop evaluation. She has had stable LE edema and SOB. No chest pain. She experiencing hot flashes different times of the day.  Home Medications  Prior to Admission medications   Medication Sig Start Date End Date Taking? Authorizing Provider  amitriptyline (ELAVIL) 10 MG tablet Take 10 mg by mouth at bedtime.   Yes Historical Provider, MD  furosemide (LASIX) 40 MG tablet Take 40 mg by mouth.   Yes Historical Provider, MD  lansoprazole (PREVACID) 15 MG capsule Take 15 mg by mouth daily at 12 noon.   Yes Historical Provider, MD  lisinopril (PRINIVIL,ZESTRIL) 20 MG tablet Take 20 mg by mouth daily.   Yes Historical Provider, MD  loratadine (CLARITIN) 10 MG tablet Take 10 mg by mouth daily.   Yes Historical Provider, MD  meloxicam (MOBIC) 15 MG tablet Take 15 mg by  mouth daily.   Yes Historical Provider, MD  Multiple Vitamin (MULTIVITAMIN) capsule Take 1 capsule by mouth daily.   Yes Historical Provider, MD  pravastatin (PRAVACHOL) 20 MG tablet Take 20 mg by mouth daily.   Yes Historical Provider, MD  traMADol (ULTRAM) 50 MG tablet Take by mouth every 6 (six) hours as needed.   Yes Historical Provider, MD    Family History  Family History  Problem Relation Age of Onset  . Hypertension      family history  . Rheum arthritis      family history  . Stroke      family history    Social History  History   Social History  . Marital Status: Married    Spouse Name: N/A    Number of Children: N/A  . Years of  Education: N/A   Occupational History  . Not on file.   Social History Main Topics  . Smoking status: Former Research scientist (life sciences)  . Smokeless tobacco: Not on file  . Alcohol Use: No  . Drug Use: No  . Sexual Activity: Not on file   Other Topics Concern  . Not on file   Social History Narrative  . No narrative on file     Review of Systems General:  No chills, fever, night sweats or weight changes.  Cardiovascular:  No chest pain, dyspnea on exertion, edema, orthopnea, palpitations, paroxysmal nocturnal dyspnea. Dermatological: No rash, lesions/masses Respiratory: No cough, dyspnea Urologic: No hematuria, dysuria Abdominal:   No nausea, vomiting, diarrhea, bright red blood per rectum, melena, or hematemesis Neurologic:  No visual changes, wkns, changes in mental status. All other systems reviewed and are otherwise negative except as noted above.  Physical Exam  Blood pressure 145/75, pulse 73, height 5\' 4"  (1.626 m), weight 338 lb (153.316 kg).  General: Pleasant, NAD, morbidly obese Psych: Normal affect. Neuro: Alert and oriented X 3. Moves all extremities spontaneously. HEENT: Normal  Neck: Supple without bruits or JVD. Lungs:  Resp regular and unlabored, CTA. Heart: RRR no s3, s4, or murmurs. Abdomen: Soft, non-tender, non-distended, BS + x 4.  Extremities: No clubbing, cyanosis, moderate B/L lymphedema. DP/PT/Radials 2+ and equal bilaterally.  Accessory Clinical Findings  ECG - sinus rhythm, 84 beats per minute, normal EKG   Assessment & Plan  58 year old female with h/o vulvar squamous cell cancer  1. Chronic diastolic congestive heart failure - with DOE, LE edema - the edema is chronic and has contribution of lympedema post lymphnode resection. However, the patient lost 12 pounds after the first month oral Lasix then plateaoud, added metolazone with some additional improvement of LE edema.  She still has edema that is probably chronic lymphpedema but significantly improved  SOB.   2. Hypertension - borderline, overall improved since she started coming to our clinic. considering CKD stage 3, we will d/c lisinopril and start amlodipine 5 mg po daily. Echocardiogram showed pseudonormal pattern of diastolic dysfunction with elevated filling pressures. We will continue diuresis and continue carvedilol to 25 mg po bid.   3. Obesity - the patient is very sedentary, currently not doing any activity. She was counseled about proper exercise and encouraged to walk daily that would help to mobilize fluids from her legs as well. We didn't recommend to use weight loss pills. We will refer her to the bariatric center in [place] where she lives.  4. Lipids - labs from PCP  5. Preop eval for knee surgery - there is currently no contraindication for  this patient to undergo the knee replacement. Considering her level of obesity and chronic diastolic dysfunction she is considered a moderate risk for intermediate risk surgery. Continue carvedilol in the peri-op period.  Follow up in 3 month. BMP today.  Dorothy Spark, MD 12/25/2013, 9:41 AM

## 2014-01-02 HISTORY — PX: TOTAL KNEE ARTHROPLASTY: SHX125

## 2014-01-06 DIAGNOSIS — M179 Osteoarthritis of knee, unspecified: Secondary | ICD-10-CM | POA: Insufficient documentation

## 2014-01-06 DIAGNOSIS — M1710 Unilateral primary osteoarthritis, unspecified knee: Secondary | ICD-10-CM | POA: Insufficient documentation

## 2014-01-16 ENCOUNTER — Other Ambulatory Visit: Payer: Self-pay | Admitting: Cardiology

## 2014-02-03 ENCOUNTER — Other Ambulatory Visit: Payer: Self-pay | Admitting: Cardiology

## 2014-02-17 ENCOUNTER — Other Ambulatory Visit: Payer: Self-pay

## 2014-02-17 MED ORDER — METOLAZONE 2.5 MG PO TABS: ORAL_TABLET | ORAL | Status: DC

## 2014-03-02 ENCOUNTER — Encounter: Payer: Self-pay | Admitting: Cardiology

## 2014-03-02 ENCOUNTER — Ambulatory Visit (INDEPENDENT_AMBULATORY_CARE_PROVIDER_SITE_OTHER): Payer: Medicare Other | Admitting: Cardiology

## 2014-03-02 VITALS — BP 132/62 | HR 70 | Ht 64.0 in | Wt 346.0 lb

## 2014-03-02 DIAGNOSIS — I10 Essential (primary) hypertension: Secondary | ICD-10-CM

## 2014-03-02 DIAGNOSIS — M8700 Idiopathic aseptic necrosis of unspecified bone: Secondary | ICD-10-CM

## 2014-03-02 DIAGNOSIS — R609 Edema, unspecified: Secondary | ICD-10-CM

## 2014-03-02 DIAGNOSIS — I5032 Chronic diastolic (congestive) heart failure: Secondary | ICD-10-CM

## 2014-03-02 LAB — BASIC METABOLIC PANEL
BUN: 31 mg/dL — ABNORMAL HIGH (ref 6–23)
CO2: 33 mEq/L — ABNORMAL HIGH (ref 19–32)
Calcium: 9.3 mg/dL (ref 8.4–10.5)
Chloride: 97 mEq/L (ref 96–112)
Creatinine, Ser: 1.4 mg/dL — ABNORMAL HIGH (ref 0.4–1.2)
GFR: 42.53 mL/min — ABNORMAL LOW (ref >60.00)
Glucose, Bld: 98 mg/dL (ref 70–99)
Potassium: 4.9 mEq/L (ref 3.5–5.1)
Sodium: 139 mEq/L (ref 135–145)

## 2014-03-02 MED ORDER — FUROSEMIDE 40 MG PO TABS: 40.0000 mg | ORAL_TABLET | Freq: Every day | ORAL | Status: DC

## 2014-03-02 MED ORDER — FUROSEMIDE 40 MG PO TABS: ORAL_TABLET | ORAL | Status: DC

## 2014-03-02 NOTE — Progress Notes (Signed)
Patient ID: [name], female   DOB: [birth year], 57 y.o.   MRN: [id number]    Patient Name: [name] [name] Date of Encounter: 03/02/2014  Primary Care Provider:  Tula Nakayama Primary Cardiologist:  Dorothy Spark   Patient Profile Hypertension, DOE  Problem List   Past Medical History  Diagnosis Date  . Essential hypertension, benign   . Other and unspecified hyperlipidemia   . Other malignant neoplasm without specification of site   . Polyp of nasal cavity   . Osteoarthrosis, unspecified whether generalized or localized, unspecified site   . Rheumatoid arthritis(714.0)   . Urinary frequency   . Absence of menstruation   . Aseptic necrosis of bone, site unspecified     bilateral tallus  . Malignant neoplasm of vulva, unspecified site     s/p vulectomy  . Carpal tunnel syndrome     bilateral  . Esophageal reflux   . DOE (dyspnea on exertion)   . Lymphedema   . Cancer of skin, squamous cell     vulva   Past Surgical History  Procedure Laterality Date  . Radical vulvectomy    . Lymphadenectomy Bilateral     inguinal   . Cesarean section    . Colonoscopy w/ polypectomy      negative polyps    Allergies  Allergies  Allergen Reactions  . Penicillins Other (See Comments)    REACTION: Unknown reaction    HPI  57 year old patient with h/o HTN, squamous cell cancer of the vulva, s/p resection, currently in remission. The patient is coming complaining of dyspnea on exertion. The patient states that ever since she had her surgery and lymph node removal from both of her groins she developed significant lower extremity edema, became less active and gained a lot of weight. The patient states that she has no disease in the last year worsening dyspnea on exertion, or doing house chores, walking couple flight of stairs, she denies chest pain, palpitations or syncope. She states that her lower extremity edema is trying since her surgery doubling her furosemide from  40-80 mg daily improved gait in the last couple of weeks.  The patient is coming after two months, we had to hold lasix for a while as her Crea has increased (1.6) and then restart when Crea back to 1.3. Currently on Lasix 40 mg daily and metolazone 2.5 mg daily. She refused to be weighted today but states that overall she lost weight. (after the first month on lasix 12 lbs). She is asking about weight loss pills and approach to loosing weight.  The patient is scheduled for a knee sx on 01/06/2014, she needs preop evaluation. She has had stable LE edema and SOB. No chest pain. She experiencing hot flashes different times of the day.  03/02/2014 -  This is a 2 months., Patient is post knee replacement without any complication and actually feels great. Patient started to repeat for lymphedema with wrapping of her one leg at a time 3 times a week. The patient states that on few occasions she felt mild worsening of shortness of breath and orthopnea. She otherwise has decreased swelling especially in the LAD is being treated right now, currently denies any shortness of breath or chest pain.   Home Medications  Prior to Admission medications   Medication Sig Start Date End Date Taking? Authorizing Provider  amitriptyline (ELAVIL) 10 MG tablet Take 10 mg by mouth at bedtime.   Yes Historical Provider, MD  furosemide (LASIX) 40 MG tablet Take 40 mg by mouth.   Yes Historical Provider, MD  lansoprazole (PREVACID) 15 MG capsule Take 15 mg by mouth daily at 12 noon.   Yes Historical Provider, MD  lisinopril (PRINIVIL,ZESTRIL) 20 MG tablet Take 20 mg by mouth daily.   Yes Historical Provider, MD  loratadine (CLARITIN) 10 MG tablet Take 10 mg by mouth daily.   Yes Historical Provider, MD  meloxicam (MOBIC) 15 MG tablet Take 15 mg by mouth daily.   Yes Historical Provider, MD  Multiple Vitamin (MULTIVITAMIN) capsule Take 1 capsule by mouth daily.   Yes Historical Provider, MD  pravastatin (PRAVACHOL) 20 MG  tablet Take 20 mg by mouth daily.   Yes Historical Provider, MD  traMADol (ULTRAM) 50 MG tablet Take by mouth every 6 (six) hours as needed.   Yes Historical Provider, MD    Family History  Family History  Problem Relation Age of Onset  . Hypertension      family history  . Rheum arthritis      family history  . Stroke      family history    Social History  History   Social History  . Marital Status: Married    Spouse Name: N/A    Number of Children: N/A  . Years of Education: N/A   Occupational History  . Not on file.   Social History Main Topics  . Smoking status: Former Research scientist (life sciences)  . Smokeless tobacco: Not on file  . Alcohol Use: No  . Drug Use: No  . Sexual Activity: Not on file   Other Topics Concern  . Not on file   Social History Narrative  . No narrative on file     Review of Systems General:  No chills, fever, night sweats or weight changes.  Cardiovascular:  No chest pain, dyspnea on exertion, edema, orthopnea, palpitations, paroxysmal nocturnal dyspnea. Dermatological: No rash, lesions/masses Respiratory: No cough, dyspnea Urologic: No hematuria, dysuria Abdominal:   No nausea, vomiting, diarrhea, bright red blood per rectum, melena, or hematemesis Neurologic:  No visual changes, wkns, changes in mental status. All other systems reviewed and are otherwise negative except as noted above.  Physical Exam  Blood pressure 132/62, pulse 70, height 5\' 4"  (1.626 m), weight 346 lb (156.945 kg), SpO2 92.00%.  General: Pleasant, NAD, morbidly obese Psych: Normal affect. Neuro: Alert and oriented X 3. Moves all extremities spontaneously. HEENT: Normal  Neck: Supple without bruits or JVD. Lungs:  Resp regular and unlabored, CTA. Heart: RRR no s3, s4, or murmurs. Abdomen: Soft, non-tender, non-distended, BS + x 4.  Extremities: No clubbing, cyanosis, moderate B/L lymphedema left more than right. DP/PT/Radials 2+ and equal bilaterally.  Accessory Clinical  Findings  ECG - sinus rhythm, 84 beats per minute, normal EKG   Assessment & Plan  58 year old female with h/o vulvar squamous cell cancer  1. Chronic diastolic congestive heart failure - with DOE, LE edema - the edema is chronic and has contribution of lympedema post lymphnode resection. However, the patient lost 12 pounds after the first month oral Lasix then plateaoud, added metolazone with some additional improvement of LE edema. She still has edema that is probably chronic lymphpedema but significantly improved SOB.   She is currently undergoing intense or before lymphedema, I believe that in the long term it will significantly improve her diastolic heart failure however we have to be careful to hold that significant venous return and burden on her heart at the  time of therapy. Therefore we will increase Lasix to 40 mg twice a day on the days of her wrapping. We will check her electrolytes and kidney function today.  2. Hypertension - finally controlled after adding carvedilol, we stopped the ACE inhibitor considering CKD stage 3. Echocardiogram showed pseudonormal pattern of diastolic dysfunction with elevated filling pressures. We will continue diuresis and increase carvedilol to 25 mg po bid.   3. Obesity - the patient is very sedentary, currently not doing any activity. She was counseled about proper exercise and encouraged to walk daily that would help to mobilize fluids from her legs as well. We didn't recommend to use weight loss pills. We will refer her to the bariatric center in [place] where she lives.  4. Lipids - labs from PCP  Follow up in 4 month. BMP today.  Dorothy Spark, MD 03/02/2014, 9:30 AM

## 2014-03-02 NOTE — Patient Instructions (Signed)
Your physician has recommended you make the following change in your medication:   START TAKING 40 MG OF LASIX TWICE DAILY ON THE DAYS YOU GO TO PT TO HAVE YOUR LEGS WRAPPED  AND TAKE ORIGINAL LASIX 40 MG DAILY ON THE DAYS YOU ARE NOT AT PT.   Your physician recommends that you return for lab work in: [place])  Your physician wants you to follow-up in: [place] will receive a reminder letter in the mail two months in advance. If you don't receive a letter, please call our office to schedule the follow-up appointment.

## 2014-03-03 ENCOUNTER — Telehealth: Payer: Self-pay | Admitting: *Deleted

## 2014-03-03 DIAGNOSIS — R609 Edema, unspecified: Secondary | ICD-10-CM

## 2014-03-03 DIAGNOSIS — M8700 Idiopathic aseptic necrosis of unspecified bone: Secondary | ICD-10-CM

## 2014-03-03 DIAGNOSIS — I5032 Chronic diastolic (congestive) heart failure: Secondary | ICD-10-CM

## 2014-03-03 DIAGNOSIS — I10 Essential (primary) hypertension: Secondary | ICD-10-CM

## 2014-03-03 NOTE — Telephone Encounter (Signed)
Notified pt that recent lab work shows stable per Dr Meda Coffee.  Also informed pt that per Dr Meda Coffee she should have repeat labs in 2 months (bmet).  Pt states she not sure she can come back in 2 months because she lives so far away, but she will look at her calendar and call us back.  Informed pt that will be ok.

## 2014-03-03 NOTE — Telephone Encounter (Signed)
Message copied by Nuala Alpha on Tue Mar 03, 2014 10:48 AM ------      Message from: Dorothy Spark      Created: Mon Mar 02, 2014 11:31 PM       Stable labs, I would repeat in 2 months ------

## 2014-03-07 ENCOUNTER — Other Ambulatory Visit: Payer: Self-pay | Admitting: Cardiology

## 2014-03-07 NOTE — Telephone Encounter (Signed)
Ms. Coyne stated she was out of her Coreg, and requested a refill. Upon the reviewing her medication list. There was a refill ordered for a one-month supply of Coreg 25 mg. I signed this and advised the patient I have done so. There were no other questions or concerns.

## 2014-04-03 ENCOUNTER — Other Ambulatory Visit: Payer: Self-pay | Admitting: Physician Assistant

## 2014-04-15 ENCOUNTER — Telehealth: Payer: Self-pay | Admitting: Cardiology

## 2014-04-15 DIAGNOSIS — R05 Cough: Secondary | ICD-10-CM

## 2014-04-15 DIAGNOSIS — R059 Cough, unspecified: Secondary | ICD-10-CM

## 2014-04-15 DIAGNOSIS — R0602 Shortness of breath: Secondary | ICD-10-CM

## 2014-04-15 NOTE — Telephone Encounter (Signed)
New message      Pt states she is not feeling well.  She is having sob--but she has that all of the time

## 2014-04-15 NOTE — Telephone Encounter (Signed)
Pt contacting Dr Meda Coffee to see if she could possibly be worked in to see her sometime this week.  Pt states she is having increased sob.  Pt denies any cp, palpitations, feeling faint or dizzy, syncopal episodes, or any cardiac complaints at this time.  Pt states she was seen by her PCP on Monday and was dx with Bronchitis and is being treated with Keflex.  Pt states no other meds were prescribed.  Pt states that her PCP took blood work, but has never called her back with the results Pt states she is feeling much worse, and she thinks its her heart.  Asked pt if she is experiencing in LEE.  Pt states she always has edema in her lower extremities, because she has lymphedema and has her legs wrapped daily.  Asked pt if she is weighing herself daily. Pt states no.  Advised pt she should weigh herself at the same time everyday, for that is pertinent information on determining if pt is holding fluid.  Asked pt if she has a productive cough.  Pt states she does have a cough, but nothing is coming up.  Pt states "when I cough I feel like something wants to come out, but it doesn't." Pt unsure if she is running a fever or not.  Informed pt that Dr Meda Coffee is out of the office the rest of the week.  Advised pt that given her complaints/symptoms, she needs to contact her PCP and schedule for another appt because she is not feeling better.  Advised pt that maybe her PCP should consider doing a CXR to rule out pneumonia.  Advised pt that she should drink water to thin her mucous, so she will be able to have a productive cough.  Informed pt that she could ask her PCP for an expectorant, or maybe even nebulizer treatment to help with the URI.  Informed pt that I will route this message to Dr Meda Coffee for further review and recommendation, and follow-up with her thereafter.  Pt verbalized understanding and agrees with this plan.

## 2014-04-16 ENCOUNTER — Telehealth: Payer: Self-pay | Admitting: *Deleted

## 2014-04-16 NOTE — Telephone Encounter (Signed)
Please order a chest X ray for this patient ASAP and schedule her for Monday 8/17 with me in the clinic. If she continues to feel worse she should go to ER. Thank you, K

## 2014-04-16 NOTE — Telephone Encounter (Signed)
Pt 's appointment time for Dr. Meda Coffee on Monday 04/20/14 has been changed from 9:00 AM to 3:00 PM with Dr. Meda Coffee is in  the office clinic in the afternoons this week. Pt is aware.

## 2014-04-16 NOTE — Telephone Encounter (Signed)
Pt is aware  of MD's recommendations. Pt has an order for a chest xray in [facility] [place]. Pt has an appointment with Dr. Meda Coffee on Monday 04/20/14/ at 9:00 AM. Pt is aware.

## 2014-04-16 NOTE — Addendum Note (Signed)
Addended by: Lynann Bologna on: 04/16/2014 01:11 PM   Modules accepted: Orders

## 2014-04-17 ENCOUNTER — Ambulatory Visit (INDEPENDENT_AMBULATORY_CARE_PROVIDER_SITE_OTHER): Payer: Medicare Other

## 2014-04-17 ENCOUNTER — Telehealth: Payer: Self-pay | Admitting: Cardiology

## 2014-04-17 DIAGNOSIS — R0602 Shortness of breath: Secondary | ICD-10-CM

## 2014-04-17 DIAGNOSIS — R059 Cough, unspecified: Secondary | ICD-10-CM

## 2014-04-17 DIAGNOSIS — J90 Pleural effusion, not elsewhere classified: Secondary | ICD-10-CM

## 2014-04-17 DIAGNOSIS — I517 Cardiomegaly: Secondary | ICD-10-CM

## 2014-04-17 DIAGNOSIS — I10 Essential (primary) hypertension: Secondary | ICD-10-CM

## 2014-04-17 DIAGNOSIS — R05 Cough: Secondary | ICD-10-CM

## 2014-04-17 NOTE — Telephone Encounter (Signed)
Pt calling to see if Dr Meda Coffee has gone over her chest xray that was ordered.  Informed pt of the preliminary results, but informed her that Dr Meda Coffee has not yet reviewed this and advised.  Advised pt to continue with currently prescribed medications, drink water, monitor her temp, and take inhalers as needed.  Provided pt education on when to be concerned and when to call 911.  Informed pt of her scheduled appt with Dr Meda Coffee on Monday at 3pm.  Pt very appreciative for all the assistance provided.  Will route this message to Dr Meda Coffee for further review and recommendation.

## 2014-04-17 NOTE — Telephone Encounter (Signed)
New message      Do we have xray results from this am?

## 2014-04-19 ENCOUNTER — Encounter (HOSPITAL_COMMUNITY): Payer: Self-pay | Admitting: Pulmonary Disease

## 2014-04-19 ENCOUNTER — Inpatient Hospital Stay (HOSPITAL_COMMUNITY)
Admission: AD | Admit: 2014-04-19 | Discharge: 2014-04-23 | DRG: 291 | Disposition: A | Discharge status: Home / Self Care | Payer: Medicare Other | Source: Other Acute Inpatient Hospital | Attending: Pulmonary Disease | Admitting: Pulmonary Disease

## 2014-04-19 DIAGNOSIS — N183 Chronic kidney disease, stage 3 unspecified: Secondary | ICD-10-CM | POA: Diagnosis present

## 2014-04-19 DIAGNOSIS — Z96659 Presence of unspecified artificial knee joint: Secondary | ICD-10-CM

## 2014-04-19 DIAGNOSIS — Z823 Family history of stroke: Secondary | ICD-10-CM | POA: Diagnosis not present

## 2014-04-19 DIAGNOSIS — J9602 Acute respiratory failure with hypercapnia: Secondary | ICD-10-CM | POA: Insufficient documentation

## 2014-04-19 DIAGNOSIS — M109 Gout, unspecified: Secondary | ICD-10-CM | POA: Diagnosis present

## 2014-04-19 DIAGNOSIS — Z87891 Personal history of nicotine dependence: Secondary | ICD-10-CM | POA: Diagnosis not present

## 2014-04-19 DIAGNOSIS — K219 Gastro-esophageal reflux disease without esophagitis: Secondary | ICD-10-CM | POA: Diagnosis present

## 2014-04-19 DIAGNOSIS — I509 Heart failure, unspecified: Secondary | ICD-10-CM

## 2014-04-19 DIAGNOSIS — IMO0001 Reserved for inherently not codable concepts without codable children: Secondary | ICD-10-CM | POA: Diagnosis present

## 2014-04-19 DIAGNOSIS — I5032 Chronic diastolic (congestive) heart failure: Secondary | ICD-10-CM | POA: Insufficient documentation

## 2014-04-19 DIAGNOSIS — Z791 Long term (current) use of non-steroidal anti-inflammatories (NSAID): Secondary | ICD-10-CM

## 2014-04-19 DIAGNOSIS — R0989 Other specified symptoms and signs involving the circulatory and respiratory systems: Secondary | ICD-10-CM | POA: Diagnosis present

## 2014-04-19 DIAGNOSIS — Z6841 Body Mass Index (BMI) 40.0 and over, adult: Secondary | ICD-10-CM

## 2014-04-19 DIAGNOSIS — E1165 Type 2 diabetes mellitus with hyperglycemia: Secondary | ICD-10-CM

## 2014-04-19 DIAGNOSIS — E875 Hyperkalemia: Secondary | ICD-10-CM | POA: Diagnosis present

## 2014-04-19 DIAGNOSIS — J440 Chronic obstructive pulmonary disease with acute lower respiratory infection: Secondary | ICD-10-CM | POA: Diagnosis present

## 2014-04-19 DIAGNOSIS — I5189 Other ill-defined heart diseases: Secondary | ICD-10-CM | POA: Insufficient documentation

## 2014-04-19 DIAGNOSIS — J9600 Acute respiratory failure, unspecified whether with hypoxia or hypercapnia: Secondary | ICD-10-CM | POA: Insufficient documentation

## 2014-04-19 DIAGNOSIS — E785 Hyperlipidemia, unspecified: Secondary | ICD-10-CM | POA: Diagnosis present

## 2014-04-19 DIAGNOSIS — E662 Morbid (severe) obesity with alveolar hypoventilation: Secondary | ICD-10-CM | POA: Diagnosis present

## 2014-04-19 DIAGNOSIS — E118 Type 2 diabetes mellitus with unspecified complications: Secondary | ICD-10-CM

## 2014-04-19 DIAGNOSIS — Z8544 Personal history of malignant neoplasm of other female genital organs: Secondary | ICD-10-CM

## 2014-04-19 DIAGNOSIS — Z7982 Long term (current) use of aspirin: Secondary | ICD-10-CM | POA: Diagnosis not present

## 2014-04-19 DIAGNOSIS — I10 Essential (primary) hypertension: Secondary | ICD-10-CM

## 2014-04-19 DIAGNOSIS — I5033 Acute on chronic diastolic (congestive) heart failure: Secondary | ICD-10-CM | POA: Diagnosis present

## 2014-04-19 DIAGNOSIS — J441 Chronic obstructive pulmonary disease with (acute) exacerbation: Secondary | ICD-10-CM | POA: Diagnosis present

## 2014-04-19 DIAGNOSIS — M1990 Unspecified osteoarthritis, unspecified site: Secondary | ICD-10-CM | POA: Diagnosis present

## 2014-04-19 DIAGNOSIS — E668 Other obesity: Secondary | ICD-10-CM | POA: Insufficient documentation

## 2014-04-19 DIAGNOSIS — J9620 Acute and chronic respiratory failure, unspecified whether with hypoxia or hypercapnia: Secondary | ICD-10-CM | POA: Diagnosis present

## 2014-04-19 DIAGNOSIS — C519 Malignant neoplasm of vulva, unspecified: Secondary | ICD-10-CM | POA: Diagnosis present

## 2014-04-19 DIAGNOSIS — I2789 Other specified pulmonary heart diseases: Secondary | ICD-10-CM | POA: Diagnosis present

## 2014-04-19 DIAGNOSIS — J9622 Acute and chronic respiratory failure with hypercapnia: Secondary | ICD-10-CM

## 2014-04-19 DIAGNOSIS — Z880 Allergy status to penicillin: Secondary | ICD-10-CM

## 2014-04-19 DIAGNOSIS — R609 Edema, unspecified: Secondary | ICD-10-CM | POA: Diagnosis present

## 2014-04-19 DIAGNOSIS — R0609 Other forms of dyspnea: Secondary | ICD-10-CM | POA: Diagnosis present

## 2014-04-19 DIAGNOSIS — G4700 Insomnia, unspecified: Secondary | ICD-10-CM | POA: Diagnosis present

## 2014-04-19 DIAGNOSIS — I890 Lymphedema, not elsewhere classified: Secondary | ICD-10-CM | POA: Diagnosis present

## 2014-04-19 DIAGNOSIS — Z9181 History of falling: Secondary | ICD-10-CM

## 2014-04-19 DIAGNOSIS — J209 Acute bronchitis, unspecified: Secondary | ICD-10-CM | POA: Diagnosis present

## 2014-04-19 DIAGNOSIS — I129 Hypertensive chronic kidney disease with stage 1 through stage 4 chronic kidney disease, or unspecified chronic kidney disease: Secondary | ICD-10-CM | POA: Diagnosis present

## 2014-04-19 DIAGNOSIS — N912 Amenorrhea, unspecified: Secondary | ICD-10-CM

## 2014-04-19 DIAGNOSIS — J309 Allergic rhinitis, unspecified: Secondary | ICD-10-CM

## 2014-04-19 HISTORY — DX: Gout, unspecified: M10.9

## 2014-04-19 HISTORY — DX: Malignant neoplasm of vulva, unspecified: C51.9

## 2014-04-19 HISTORY — DX: Personal history of nicotine dependence: Z87.891

## 2014-04-19 HISTORY — DX: Mucopurulent chronic bronchitis: J41.1

## 2014-04-19 HISTORY — DX: Morbid (severe) obesity due to excess calories: E66.01

## 2014-04-19 LAB — GLUCOSE, CAPILLARY
GLUCOSE-CAPILLARY: 165 mg/dL — AB (ref 70–99)
GLUCOSE-CAPILLARY: 156 mg/dL — AB (ref 70–99)
Glucose-Capillary: 156 mg/dL — ABNORMAL HIGH (ref 70–99)

## 2014-04-19 LAB — MRSA PCR SCREENING: MRSA BY PCR: NEGATIVE

## 2014-04-19 MED ORDER — FUROSEMIDE 40 MG PO TABS
40.0000 mg | ORAL_TABLET | Freq: Every day | ORAL | Status: DC
  Administered 2014-04-20: 40 mg via ORAL
  Filled 2014-04-19: qty 1

## 2014-04-19 MED ORDER — ALBUTEROL SULFATE (2.5 MG/3ML) 0.083% IN NEBU: 2.5000 mg | INHALATION_SOLUTION | Freq: Four times a day (QID) | RESPIRATORY_TRACT | Status: DC

## 2014-04-19 MED ORDER — AMLODIPINE BESYLATE 5 MG PO TABS
5.0000 mg | ORAL_TABLET | Freq: Every day | ORAL | Status: DC
  Administered 2014-04-19 – 2014-04-23 (×5): 5 mg via ORAL
  Filled 2014-04-19 (×5): qty 1

## 2014-04-19 MED ORDER — IPRATROPIUM BROMIDE 0.02 % IN SOLN: 0.5000 mg | Freq: Four times a day (QID) | RESPIRATORY_TRACT | Status: DC

## 2014-04-19 MED ORDER — INSULIN ASPART 100 UNIT/ML ~~LOC~~ SOLN
0.0000 [IU] | Freq: Three times a day (TID) | SUBCUTANEOUS | Status: DC
  Administered 2014-04-19: 3 [IU] via SUBCUTANEOUS
  Administered 2014-04-20 – 2014-04-23 (×4): 2 [IU] via SUBCUTANEOUS

## 2014-04-19 MED ORDER — SODIUM CHLORIDE 0.9 % IJ SOLN: 3.0000 mL | INTRAMUSCULAR | Status: DC | PRN

## 2014-04-19 MED ORDER — ALBUTEROL SULFATE (2.5 MG/3ML) 0.083% IN NEBU: 2.5000 mg | INHALATION_SOLUTION | RESPIRATORY_TRACT | Status: DC | PRN

## 2014-04-19 MED ORDER — LEVOFLOXACIN 500 MG PO TABS
500.0000 mg | ORAL_TABLET | Freq: Every day | ORAL | Status: DC
  Administered 2014-04-19 – 2014-04-20 (×2): 500 mg via ORAL
  Filled 2014-04-19 (×3): qty 1

## 2014-04-19 MED ORDER — ENOXAPARIN SODIUM 40 MG/0.4ML ~~LOC~~ SOLN
40.0000 mg | SUBCUTANEOUS | Status: DC
  Administered 2014-04-19: 40 mg via SUBCUTANEOUS
  Filled 2014-04-19 (×2): qty 0.4

## 2014-04-19 MED ORDER — AMITRIPTYLINE HCL 100 MG PO TABS
100.0000 mg | ORAL_TABLET | Freq: Every evening | ORAL | Status: DC | PRN
  Filled 2014-04-19 (×2): qty 1

## 2014-04-19 MED ORDER — SODIUM CHLORIDE 0.9 % IV SOLN: 250.0000 mL | INTRAVENOUS | Status: DC | PRN

## 2014-04-19 MED ORDER — SODIUM CHLORIDE 0.9 % IJ SOLN
3.0000 mL | Freq: Two times a day (BID) | INTRAMUSCULAR | Status: DC
  Administered 2014-04-19 – 2014-04-23 (×6): 3 mL via INTRAVENOUS

## 2014-04-19 MED ORDER — CETYLPYRIDINIUM CHLORIDE 0.05 % MT LIQD
7.0000 mL | Freq: Two times a day (BID) | OROMUCOSAL | Status: DC
Start: 2014-04-19 — End: 2014-04-23
  Administered 2014-04-20 – 2014-04-23 (×7): 7 mL via OROMUCOSAL

## 2014-04-19 MED ORDER — INSULIN ASPART 100 UNIT/ML ~~LOC~~ SOLN
0.0000 [IU] | Freq: Every day | SUBCUTANEOUS | Status: DC
  Administered 2014-04-21: 23:00:00 via SUBCUTANEOUS

## 2014-04-19 MED ORDER — FUROSEMIDE 10 MG/ML IJ SOLN
40.0000 mg | INTRAMUSCULAR | Status: AC
  Administered 2014-04-19 (×2): 40 mg via INTRAVENOUS
  Filled 2014-04-19 (×2): qty 4

## 2014-04-19 MED ORDER — METOLAZONE 2.5 MG PO TABS
2.5000 mg | ORAL_TABLET | Freq: Every day | ORAL | Status: DC
  Administered 2014-04-20: 2.5 mg via ORAL
  Filled 2014-04-19: qty 1

## 2014-04-19 MED ORDER — ALLOPURINOL 300 MG PO TABS
300.0000 mg | ORAL_TABLET | Freq: Every day | ORAL | Status: DC
  Administered 2014-04-19 – 2014-04-23 (×5): 300 mg via ORAL
  Filled 2014-04-19 (×5): qty 1

## 2014-04-19 MED ORDER — CARVEDILOL 25 MG PO TABS
25.0000 mg | ORAL_TABLET | Freq: Two times a day (BID) | ORAL | Status: DC
  Administered 2014-04-19 – 2014-04-20 (×2): 25 mg via ORAL
  Filled 2014-04-19 (×4): qty 1

## 2014-04-19 MED ORDER — IPRATROPIUM-ALBUTEROL 0.5-2.5 (3) MG/3ML IN SOLN
3.0000 mL | Freq: Four times a day (QID) | RESPIRATORY_TRACT | Status: DC
  Administered 2014-04-19 – 2014-04-22 (×12): 3 mL via RESPIRATORY_TRACT
  Filled 2014-04-19 (×12): qty 3

## 2014-04-19 MED ORDER — INSULIN ASPART 100 UNIT/ML ~~LOC~~ SOLN
4.0000 [IU] | Freq: Three times a day (TID) | SUBCUTANEOUS | Status: DC
  Administered 2014-04-19 – 2014-04-23 (×10): 4 [IU] via SUBCUTANEOUS

## 2014-04-19 MED ORDER — PANTOPRAZOLE SODIUM 40 MG PO TBEC
40.0000 mg | DELAYED_RELEASE_TABLET | Freq: Every day | ORAL | Status: DC
  Administered 2014-04-20 – 2014-04-23 (×4): 40 mg via ORAL
  Filled 2014-04-19 (×4): qty 1

## 2014-04-19 MED ORDER — BUDESONIDE 0.25 MG/2ML IN SUSP
0.2500 mg | Freq: Four times a day (QID) | RESPIRATORY_TRACT | Status: DC
  Administered 2014-04-19: 0.25 mg via RESPIRATORY_TRACT
  Filled 2014-04-19 (×3): qty 2

## 2014-04-19 MED ORDER — BUDESONIDE 0.25 MG/2ML IN SUSP
0.2500 mg | Freq: Four times a day (QID) | RESPIRATORY_TRACT | Status: DC
  Administered 2014-04-19 – 2014-04-22 (×11): 0.25 mg via RESPIRATORY_TRACT
  Filled 2014-04-19 (×14): qty 2

## 2014-04-19 MED ORDER — SIMVASTATIN 20 MG PO TABS
20.0000 mg | ORAL_TABLET | Freq: Every day | ORAL | Status: DC
  Administered 2014-04-19 – 2014-04-22 (×4): 20 mg via ORAL
  Filled 2014-04-19 (×5): qty 1

## 2014-04-19 NOTE — Plan of Care (Signed)
Problem: Phase I Progression Outcomes Goal: Voiding-avoid urinary catheter unless indicated Outcome: Not Met (add Reason) Pt has Foley.

## 2014-04-19 NOTE — Progress Notes (Signed)
Pt not tolerating Bipap ,she wanted it off Stating that she felt she was suffocating and couldn't breathe with the mask on.I talked to her about the importance of wearing it and asked her if she would  try again later but she was very adamant about not wearing it.

## 2014-04-19 NOTE — Progress Notes (Signed)
Pt received from [facility].  Patient placed on initial BiPAP settings of 10/5 and FiO2 of 45% until orders from MD are received.  The patient states that getting appropriate amount of pressure and states no shortness of breath.  Patient has stable vital signs on current settings and has good tidal volumes.

## 2014-04-19 NOTE — Progress Notes (Signed)
Dr. Leonidas Romberg in room, RN took pt off bipap.  Pt was placed on 2 lpm [place].  No resp distress presently noted.

## 2014-04-19 NOTE — H&P (Signed)
PULMONARY / CRITICAL CARE MEDICINE   Name: [name] [name] MRN: [id number] DOB: May 16, 1956    ADMISSION DATE:  04/19/2014  INITIAL PRESENTATION:  59 F admitted to ICU in transfer from [facility] with acute on chronic hypercarbic resp failure - multifactorial. Arrived on BiPAP. Tolerated [place] O2 initially after arrival  STUDIES:    SIGNIFICANT EVENTS:    HISTORY OF PRESENT ILLNESS:   Pleasant 61F with chronic DOE due to obesity and diastolic HF admitted in transfer from [facility] where she presented initially (on the same day of transfer) with 2-3 weeks of exertional dyspnea and intermittent CP. The dyspnea is moderate to severe. Her initial ABG revealed a PCO2 of 89 torr with pH 7.24. She was treated with with NPPV and diuresis with significant improvement. She and her family requested transfer to [facility] since she receives cardiology care here [facility]). She denies fever, pleurodynia, purulent sputum, hemoptysis. She has chronic LE edema which is slightly worse than her baseline. She has chronic orthopnea and occasional PND.   PAST MEDICAL HISTORY :  Past Medical History  Diagnosis Date  . Essential hypertension, benign   . Other and unspecified hyperlipidemia   . Polyp of nasal cavity   . Osteoarthrosis, unspecified whether generalized or localized, unspecified site   . Carpal tunnel syndrome     bilateral  . Esophageal reflux   . DOE (dyspnea on exertion)   . Lymphedema   . Vulva cancer     resected 2002  . Morbid obesity   . Diastolic heart failure     Echo 11/14 LVEF 55%. Ggrade 2 diastolic dysfunction  . Bronchitis, mucopurulent recurrent   . Former smoker   . Gout    Past Surgical History  Procedure Laterality Date  . Radical vulvectomy    . Lymphadenectomy Bilateral     inguinal   . Cesarean section    . Colonoscopy w/ polypectomy      negative polyps  . Total knee arthroplasty Right 01/2014    [facility] (Dr Rito Ehrlich)   Prior to  Admission medications   Medication Sig Start Date End Date Taking? Authorizing Provider  allopurinol (ZYLOPRIM) 300 MG tablet Take 300 mg by mouth daily.    Historical Provider, MD  amitriptyline (ELAVIL) 10 MG tablet Take 10 mg by mouth at bedtime.    Historical Provider, MD  amLODipine (NORVASC) 5 MG tablet Take 1 tablet (5 mg total) by mouth daily. 12/25/13   Dorothy Spark, MD  aspirin 325 MG tablet Take 325 mg by mouth daily.    Historical Provider, MD  carvedilol (COREG) 25 MG tablet TAKE 1 TABLET BY MOUTH TWICE DAILY 04/03/14   Dorothy Spark, MD  furosemide (LASIX) 40 MG tablet TAKE 40 MG TWICE DAILY ON PT DAYS 03/02/14   Dorothy Spark, MD  furosemide (LASIX) 40 MG tablet Take 1 tablet (40 mg total) by mouth daily. 03/02/14   Dorothy Spark, MD  lansoprazole (PREVACID) 15 MG capsule Take 15 mg by mouth daily at 12 noon.    Historical Provider, MD  loratadine (CLARITIN) 10 MG tablet Take 10 mg by mouth daily.    Historical Provider, MD  meloxicam (MOBIC) 15 MG tablet Take 15 mg by mouth daily.    Historical Provider, MD  metolazone (ZAROXOLYN) 2.5 MG tablet TAKE 1 TABLET BY MOUTH DAILY 02/17/14   Dorothy Spark, MD  pravastatin (PRAVACHOL) 20 MG tablet Take 20 mg by mouth daily.  Historical Provider, MD  traMADol (ULTRAM) 50 MG tablet Take by mouth every 6 (six) hours as needed.    Historical Provider, MD   Allergies  Allergen Reactions  . Penicillins Other (See Comments)    REACTION: Rash Unknown reaction    FAMILY HISTORY:  Family History  Problem Relation Age of Onset  . Hypertension      family history  . Rheum arthritis      family history  . Stroke      family history   SOCIAL HISTORY:  reports that she has quit smoking. Her smoking use included Cigarettes. She has a 37.5 pack-year smoking history. She does not have any smokeless tobacco history on file. She reports that she drinks alcohol. She reports that she does not use illicit drugs. No significant  occupational exposures  REVIEW OF SYSTEMS:   As per HPI. Also reports heavy snoring, mild to moderate daytime hypersomnolence.   SUBJECTIVE:   VITAL SIGNS: Pulse Rate:  [83-88] 88 (08/16 1430) Resp:  [14-18] 18 (08/16 1430) BP: (120)/(82) 120/82 mmHg (08/16 1430) SpO2:  [91 %-100 %] 91 % (08/16 1430) FiO2 (%):  [45 %] 45 % (08/16 1400) HEMODYNAMICS:   VENTILATOR SETTINGS: Vent Mode:  [-] PSV;BIPAP FiO2 (%):  [45 %] 45 % INTAKE / OUTPUT: No intake or output data in the 24 hours ending 04/19/14 1450  PHYSICAL EXAMINATION: General: Very obese, pleasant, alert, appropriate, moderate rest dyspnea Neuro:  Cns intact, motor intact, sensory intact, DTRs symmetric HEENT: Slightly plethoric, otherwise WNL Cardiovascular:  Distant HS, regular, no M Lungs: Diminished BS, faint bibasilar crackles, few scattered wheezes Abdomen:  Obese, soft, NT, NABS Ext: 3+ symmetric pretibial edema with chronic stasis changes  LABS: From [place]: 7.24/89/88 Na 133 K5.9 Cr 1.51 Hgb 12.1 Glu 154 Trop I normal  CBC No results found for this basename: WBC, HGB, HCT, PLT,  in the last 168 hours Coag's No results found for this basename: APTT, INR,  in the last 168 hours BMET No results found for this basename: NA, K, CL, CO2, BUN, CREATININE, GLUCOSE,  in the last 168 hours Electrolytes No results found for this basename: CALCIUM, MG, PHOS,  in the last 168 hours Sepsis Markers No results found for this basename: LATICACIDVEN, PROCALCITON, O2SATVEN,  in the last 168 hours ABG No results found for this basename: PHART, PCO2ART, PO2ART,  in the last 168 hours Liver Enzymes No results found for this basename: AST, ALT, ALKPHOS, BILITOT, ALBUMIN,  in the last 168 hours Cardiac Enzymes No results found for this basename: TROPONINI, PROBNP,  in the last 168 hours Glucose No results found for this basename: GLUCAP,  in the last 168 hours  Imaging No results found. CXR:  from [place] -  mild to mod interstitial porminence  ASSESSMENT / PLAN:  PULMONARY A: Acute on chronic hypercarbic/hypoxemic resp failure Former smoker, recurrent bronchitis, suspect COPD AECOPD with mild bronchospasm Suspect mild pulm edema Likely OHS Suspect OSA P:   Supplemental O2 to maintain SpO2 88-92% Nebulized steroids and BDs Lasix IV X 2, then continue home doses of diuretics Avoid systemic steroids for now PRN BiPAP during cay Mandatory BiPAP @ HS  CARDIOVASCULAR A:  Chest pain - doubt AMI. Initial TpI negative Grade 2 diastolic HF Hypertension Hyperlipidemia P:  Cont carvedilol, amlodipine, statin Will notify Dr Kenton Kingfisher in AM of her admission  RENAL A:   CKD - baseline Cr approx 1.5 Severe hypervolemia - likely due to R sided failure and lymphedema Hyperkalemia (  5.9 @ [place]) P:   Monitor BMET intermittently Monitor I/Os Correct electrolytes as indicated  GASTROINTESTINAL A:   Morbid obesity P:   Carb modified diet Nutritional counseling  HEMATOLOGIC A:   No issues P:  DVT px: enoxaparin DVT px: SQ heparin Monitor CBC intermittently Transfuse per usual ICU guidelines  INFECTIOUS A:   Acute bronchitis P:   Levofloxacin 8/16 >>   ENDOCRINE A:  Hyperglycemia without prior dx of DM   P:   Check HgB A1C SSI - mod scale with HS coverage and meal coverage  NEUROLOGIC A:   No issues P:   RASS goal: 0 Cont PRN amitriptyline qHS PRN for sleep Avoid opioids due to hypercarbia  RHEUM A; Osteoarthritis Gout P: Cont allopurinol Holding meloxicam   TODAY'S SUMMARY:   I have personally obtained a history, examined the patient, evaluated laboratory and imaging results, formulated the assessment and plan and placed orders. CRITICAL CARE: The patient is critically ill with multiple organ systems failure and requires high complexity decision making for assessment and support, frequent evaluation and titration of therapies, application of advanced  monitoring technologies and extensive interpretation of multiple databases. Critical Care Time devoted to patient care services described in this note is 60 minutes.    Merton Border, MD ; [facility] [phone].  After 5:30 PM or weekends, call [phone]   04/19/2014, 2:50 PM

## 2014-04-20 ENCOUNTER — Inpatient Hospital Stay (HOSPITAL_COMMUNITY): Payer: Medicare Other

## 2014-04-20 ENCOUNTER — Ambulatory Visit: Payer: Medicare Other | Admitting: Cardiology

## 2014-04-20 DIAGNOSIS — I10 Essential (primary) hypertension: Secondary | ICD-10-CM

## 2014-04-20 DIAGNOSIS — E785 Hyperlipidemia, unspecified: Secondary | ICD-10-CM

## 2014-04-20 DIAGNOSIS — I5033 Acute on chronic diastolic (congestive) heart failure: Secondary | ICD-10-CM

## 2014-04-20 DIAGNOSIS — E7849 Other hyperlipidemia: Secondary | ICD-10-CM | POA: Insufficient documentation

## 2014-04-20 DIAGNOSIS — N912 Amenorrhea, unspecified: Secondary | ICD-10-CM

## 2014-04-20 DIAGNOSIS — Z87891 Personal history of nicotine dependence: Secondary | ICD-10-CM

## 2014-04-20 DIAGNOSIS — J309 Allergic rhinitis, unspecified: Secondary | ICD-10-CM

## 2014-04-20 DIAGNOSIS — N183 Chronic kidney disease, stage 3 unspecified: Secondary | ICD-10-CM

## 2014-04-20 DIAGNOSIS — E782 Mixed hyperlipidemia: Secondary | ICD-10-CM | POA: Insufficient documentation

## 2014-04-20 LAB — BASIC METABOLIC PANEL
ANION GAP: 10 (ref 5–15)
ANION GAP: 13 (ref 5–15)
BUN: 45 mg/dL — ABNORMAL HIGH (ref 6–23)
BUN: 53 mg/dL — ABNORMAL HIGH (ref 6–23)
CALCIUM: 9.1 mg/dL (ref 8.4–10.5)
CHLORIDE: 89 meq/L — AB (ref 96–112)
CO2: 34 mEq/L — ABNORMAL HIGH (ref 19–32)
CO2: 32 mEq/L (ref 19–32)
CREATININE: 1.39 mg/dL — AB (ref 0.50–1.10)
Calcium: 9.3 mg/dL (ref 8.4–10.5)
Chloride: 91 mEq/L — ABNORMAL LOW (ref 96–112)
Creatinine, Ser: 1.55 mg/dL — ABNORMAL HIGH (ref 0.50–1.10)
GFR calc non Af Amer: 41 mL/min — ABNORMAL LOW (ref >90)
GFR, EST AFRICAN AMERICAN: 48 mL/min — AB (ref >90)
GFR, EST AFRICAN AMERICAN: 42 mL/min — AB (ref >90)
GFR, EST NON AFRICAN AMERICAN: 36 mL/min — AB (ref >90)
Glucose, Bld: 135 mg/dL — ABNORMAL HIGH (ref 70–99)
Glucose, Bld: 115 mg/dL — ABNORMAL HIGH (ref 70–99)
Potassium: 5.1 mEq/L (ref 3.7–5.3)
Potassium: 5.8 mEq/L — ABNORMAL HIGH (ref 3.7–5.3)
Sodium: 135 mEq/L — ABNORMAL LOW (ref 137–147)
Sodium: 134 mEq/L — ABNORMAL LOW (ref 137–147)

## 2014-04-20 LAB — CBC
HEMATOCRIT: 40.3 % (ref 36.0–46.0)
Hemoglobin: 11.8 g/dL — ABNORMAL LOW (ref 12.0–15.0)
MCH: 25.5 pg — AB (ref 26.0–34.0)
MCHC: 29.3 g/dL — AB (ref 30.0–36.0)
MCV: 87 fL (ref 78.0–100.0)
PLATELETS: 257 10*3/uL (ref 150–400)
RBC: 4.63 MIL/uL (ref 3.87–5.11)
RDW: 15.1 % (ref 11.5–15.5)
WBC: 7.1 10*3/uL (ref 4.0–10.5)

## 2014-04-20 LAB — GLUCOSE, CAPILLARY
GLUCOSE-CAPILLARY: 121 mg/dL — AB (ref 70–99)
GLUCOSE-CAPILLARY: 123 mg/dL — AB (ref 70–99)
Glucose-Capillary: 103 mg/dL — ABNORMAL HIGH (ref 70–99)
Glucose-Capillary: 112 mg/dL — ABNORMAL HIGH (ref 70–99)
Glucose-Capillary: 168 mg/dL — ABNORMAL HIGH (ref 70–99)

## 2014-04-20 LAB — PRO B NATRIURETIC PEPTIDE: Pro B Natriuretic peptide (BNP): 1338 pg/mL — ABNORMAL HIGH (ref 0–125)

## 2014-04-20 LAB — MAGNESIUM: Magnesium: 1.8 mg/dL (ref 1.5–2.5)

## 2014-04-20 LAB — HEMOGLOBIN A1C
HEMOGLOBIN A1C: 6.9 % — AB (ref <5.7)
MEAN PLASMA GLUCOSE: 151 mg/dL — AB (ref <117)

## 2014-04-20 LAB — TROPONIN I

## 2014-04-20 LAB — PHOSPHORUS: Phosphorus: 4.8 mg/dL — ABNORMAL HIGH (ref 2.3–4.6)

## 2014-04-20 MED ORDER — FUROSEMIDE 40 MG PO TABS
40.0000 mg | ORAL_TABLET | Freq: Two times a day (BID) | ORAL | Status: DC
  Filled 2014-04-20 (×2): qty 1

## 2014-04-20 MED ORDER — ENOXAPARIN SODIUM 80 MG/0.8ML ~~LOC~~ SOLN
80.0000 mg | SUBCUTANEOUS | Status: DC
  Administered 2014-04-20 – 2014-04-22 (×3): 80 mg via SUBCUTANEOUS
  Filled 2014-04-20 (×4): qty 0.8

## 2014-04-20 MED ORDER — SODIUM POLYSTYRENE SULFONATE 15 GM/60ML PO SUSP
30.0000 g | Freq: Once | ORAL | Status: AC
  Administered 2014-04-20: 30 g via ORAL
  Filled 2014-04-20: qty 120

## 2014-04-20 MED ORDER — FUROSEMIDE 10 MG/ML IJ SOLN
40.0000 mg | Freq: Two times a day (BID) | INTRAMUSCULAR | Status: DC
  Administered 2014-04-20 – 2014-04-21 (×2): 40 mg via INTRAVENOUS
  Filled 2014-04-20 (×4): qty 4

## 2014-04-20 NOTE — Plan of Care (Signed)
Problem: Phase II Progression Outcomes Goal: Progress activity as tolerated unless otherwise ordered Outcome: Progressing Pt. OOB to bedside commode.

## 2014-04-20 NOTE — Telephone Encounter (Signed)
She has been admitted yesterday. I will see her in the hospital.

## 2014-04-20 NOTE — Care Management Note (Addendum)
    Page 1 of 1   04/23/2014     2:52:58 PM CARE MANAGEMENT NOTE 04/23/2014  Patient:  [name] [name], [name] [name]   Account Number:  [id number]  Date Initiated:  04/20/2014  Documentation initiated by:  Elissa Hefty  Subjective/Objective Assessment:   adm w resp distress, bipap     Action/Plan:   lives w fam, pcp dr Bing Matter   Anticipated DC Date:  04/23/2014   Anticipated DC Plan:  [place]  CM consult      Choice offered to / List presented to:     DME arranged  BIPAP  [facility]      DME agency  [facility].        Status of service:  Completed, signed off Medicare Important Message given?  YES (If response is "NO", the following Medicare IM given date fields will be blank) Date Medicare IM given:  04/22/2014 Medicare IM given by:  Tikesha Mort Date Additional Medicare IM given:   Additional Medicare IM given by:    Discharge Disposition:    Per UR Regulation:  Reviewed for med. necessity/level of care/duration of stay  If discussed at [place] of Stay Meetings, dates discussed:    Comments:  8/201/5 [name] Lambert, RN, BSN [phone] [facility] has provided pt with BIPAP machine, and has delivered to room.  Portable oxygen tank for transport home in room as well.  04/21/14 [name] Lambert, RN, BSN [phone] CM referral for home BIPAP, home oxygen.  Met with pt to discuss home needs; pt states she has not had sleep study in the past. She is agreeable with use of [facility] for DME needs.  Referral to [facility] for home BIPAP--will need sleep study scheduled prior to dc, and settings for machine entered in order. Thanks.

## 2014-04-20 NOTE — H&P (Signed)
PULMONARY / CRITICAL CARE MEDICINE   Name: [name] [name] MRN: [id number] DOB: 1956/08/01    ADMISSION DATE:  04/19/2014  INITIAL PRESENTATION:  82 F admitted to ICU in transfer from [facility] with acute on chronic hypercarbic resp failure - multifactorial. Arrived on BiPAP. Tolerated [place] O2 initially after arrival  STUDIES:    SIGNIFICANT EVENTS: 8/17 no distress, sats excellent  SUBJECTIVE: ni NIMV  VITAL SIGNS: Temp:  [97.3 F (36.3 C)-99 F (37.2 C)] 97.3 F (36.3 C) (08/17 1244) Pulse Rate:  [76-93] 78 (08/17 0921) Resp:  [12-32] 12 (08/17 0921) BP: (82-173)/(48-147) 97/54 mmHg (08/17 0921) SpO2:  [90 %-100 %] 100 % (08/17 0921) FiO2 (%):  [40 %-60 %] 40 % (08/17 0921) Weight:  [161.6 kg (356 lb 4.2 oz)-163.8 kg (361 lb 1.8 oz)] 161.6 kg (356 lb 4.2 oz) (08/17 0500) HEMODYNAMICS:   VENTILATOR SETTINGS: Vent Mode:  [-] BIPAP FiO2 (%):  [40 %-60 %] 40 % Set Rate:  [10 bmp] 10 bmp Vt Set:  [10 mL] 10 mL INTAKE / OUTPUT:  Intake/Output Summary (Last 24 hours) at 04/20/14 1248 Last data filed at 04/20/14 1200  Gross per 24 hour  Intake    836 ml  Output   3375 ml  Net  -2539 ml    PHYSICAL EXAMINATION: General: Very obese, pleasant, alert, appropriate, no distress at all Neuro:  Cns intact, nonfocal HEENT: Slightly plethoric, otherwise WNL Cardiovascular: s1 s2 RR distant Lungs: Diminished BS Abdomen:  Obese, soft, NT, NABS Ext: 3+ symmetric pretibial edema with chronic stasis changes  LABS: From [place]: 7.24/89/88 Na 133 K5.9 Cr 1.51 Hgb 12.1 Glu 154 Trop I normal  CBC  Recent Labs Lab 04/20/14 0218  WBC 7.1  HGB 11.8*  HCT 40.3  PLT 257   Coag's No results found for this basename: APTT, INR,  in the last 168 hours BMET  Recent Labs Lab 04/20/14 0218  NA 135*  K 5.1  CL 91*  CO2 34*  BUN 45*  CREATININE 1.39*  GLUCOSE 135*   Electrolytes  Recent Labs Lab 04/20/14 0218  CALCIUM 9.1  MG 1.8  PHOS 4.8*    Sepsis Markers No results found for this basename: LATICACIDVEN, PROCALCITON, O2SATVEN,  in the last 168 hours ABG No results found for this basename: PHART, PCO2ART, PO2ART,  in the last 168 hours Liver Enzymes No results found for this basename: AST, ALT, ALKPHOS, BILITOT, ALBUMIN,  in the last 168 hours Cardiac Enzymes  Recent Labs Lab 04/20/14 0218  TROPONINI <0.30  PROBNP 1338.0*   Glucose  Recent Labs Lab 04/19/14 1405 04/19/14 1508 04/19/14 1929 04/19/14 2344 04/20/14 0749  GLUCAP 165* 156* 156* 168* 121*    Imaging No results found. CXR:  from [place] - mild to mod interstitial porminence  ASSESSMENT / PLAN:  PULMONARY A: Acute on chronic hypercarbic/hypoxemic resp failure Former smoker, recurrent bronchitis, suspect COPD AECOPD with mild bronchospasm Suspect mild pulm edema - main issue diastolic acute HF Likely OHS Suspect OSA pulm htn P:   Supplemental O2 to maintain SpO2 88-92% Nebulized steroids and BDs Lasix IV continued Avoid systemic steroids - not needed No daytime needed after neg 2 liters Mandatory BiPAP @ HS pcxr in am   CARDIOVASCULAR A:  Chest pain - doubt AMI. Initial TpI negative Grade 2 diastolic HF Hypertension Hyperlipidemia P:  Cont carvedilol, amlodipine, statin Cards to see her I would repeat echo, assess pa pressures, worsengin valvular dz BIPAP lasix  RENAL A:  CKD - baseline Cr approx 1.5 Severe hypervolemia - likely due to R sided failure and lymphedema Hyperkalemia (5.9 @ [place]) P:   Monitor BMET intermittently Monitor I/Os Correct electrolytes as indicated kayxlate x 1 lasix  GASTROINTESTINAL A:   Morbid obesity P:   Carb modified diet Nutritional counseling  HEMATOLOGIC A:   No issues P:  DVT px: enoxaparin Monitor CBC intermittently Transfuse per usual ICU guidelines  INFECTIOUS A:   Acute bronchitis P:   Levofloxacin 8/16 >>8/17 Rapid resolution with lasix, dc  abx  ENDOCRINE A:  Hyperglycemia without prior dx of DM   P:   Check HgB A1C SSI - mod scale with HS coverage and meal coverage  NEUROLOGIC A:   No issues P:   RASS goal: 0 Cont PRN amitriptyline qHS PRN for sleep Avoid opioids due to hypercarbia  RHEUM A; Osteoarthritis Gout P: Cont allopurinol, low threshold to dc this also Holding meloxicam   TODAY'S SUMMARY: called cards they will see, lasix, echo repeat, NIMV nocturnal, needs sleep study  I have personally obtained a history, examined the patient, evaluated laboratory and imaging results, formulated the assessment and plan and placed orders.  Lavon Paganini. Titus Mould, MD, [place] Pgr: [facility]

## 2014-04-20 NOTE — Progress Notes (Signed)
Nutrition Education Note  RD consulted for nutrition education regarding weight loss and a low sodium low fat diet.   RD provided "Heart Healthy Nutrition Therapy" handout from the Academy of [employer] and Dietetics (AND). Reviewed patient's dietary recall. Provided examples on ways to decrease sodium and fat intake in diet. Discouraged intake of processed foods and use of salt shaker. Encouraged fresh fruits and vegetables as well as whole grain sources of carbohydrates to maximize fiber intake.  Additionally provided "1800- Calorie 5-Day Sample Menus" and "Weight loss Tips" from AND.   Teach back method used. Pt expressed 4 goals: Stop using adding salt, no longer eating pork rinds, no longer eating snacks in bed at night, and limiting ice cream to one serving (1/2 cup). She plans to continue eating fruits and vegetables regularly.   Expect fair/good compliance.  Body mass index is 55.79 kg/(m^2). Pt meets criteria for Morbidly Obese based on current BMI.  Current diet order is Carb Modified, patient is consuming approximately 100% of meals at this time. Labs and medications reviewed. No further nutrition interventions warranted at this time. RD contact information provided. If additional nutrition issues arise, please re-consult RD.  Pryor Ochoa RD, LDN Inpatient Clinical Dietitian Pager: [phone] After Hours Pager: [phone]

## 2014-04-20 NOTE — Consult Note (Signed)
Reason for Consult:   CHF  Requesting Physician: CCM  HPI: This is a 58 y.o. female with a past medical history significant for chronic diastolic CHF, CRI stage 3, morbid obesity, and lymphedema. Her last echo was Nov 2014 and showed an EF of 55% with grade 2 diastolic dysfunction. She was admitted as a transfer from [facility] Sunday, 04/19/14 with acute on chronic respiratory failure. She tells me she was in the ER at [facility] for dyspnea. She was treated and sent home. Around 2 am Sunday she had worsening SOB and was brought back to the ER in respiratory distress and was placed on BiPAP and transferred. She is now improved and comfortable. She admits she has not been on a low sodium diet-"I love chicken soup". She has not previously been evaluated for sleep apnea. She did tolerate C-pap last night once it was adjusted.   PMHx:  Past Medical History  Diagnosis Date  . Essential hypertension, benign   . Other and unspecified hyperlipidemia   . Polyp of nasal cavity   . Osteoarthrosis, unspecified whether generalized or localized, unspecified site   . Carpal tunnel syndrome     bilateral  . Esophageal reflux   . DOE (dyspnea on exertion)   . Lymphedema   . Vulva cancer     resected 2002  . Morbid obesity   . Diastolic heart failure     Echo 11/14 LVEF 55%. Ggrade 2 diastolic dysfunction  . Bronchitis, mucopurulent recurrent   . Former smoker   . Gout   . CHF (congestive heart failure)     Past Surgical History  Procedure Laterality Date  . Radical vulvectomy    . Lymphadenectomy Bilateral     inguinal   . Cesarean section    . Colonoscopy w/ polypectomy      negative polyps  . Total knee arthroplasty Right 01/2014    [facility] (Dr Rito Ehrlich)    SOCHx:  reports that she has quit smoking. Her smoking use included Cigarettes. She has a 37.5 pack-year smoking history. She does not have any smokeless tobacco history on file. She reports that  she drinks alcohol. She reports that she does not use illicit drugs.  FAMHx: Family History  Problem Relation Age of Onset  . Hypertension      family history  . Rheum arthritis      family history  . Stroke      family history    ALLERGIES: Allergies  Allergen Reactions  . Penicillins Nausea And Vomiting and Rash  . Sulfa Antibiotics Hives and Rash    Exact drug unknown    ROS: Pertinent items are noted in HPI. See H&P for complete ROS. She denies any GI bleeding. She has never had a sleep study. Prior to admission she was ambulating with a cane.   HOME MEDICATIONS: Prior to Admission medications   Medication Sig Start Date End Date Taking? Authorizing Provider  albuterol (PROVENTIL) (2.5 MG/3ML) 0.083% nebulizer solution Take 2.5 mg by nebulization every 6 (six) hours as needed for wheezing or shortness of breath.   Yes Historical Provider, MD  allopurinol (ZYLOPRIM) 300 MG tablet Take 300 mg by mouth daily.   Yes Historical Provider, MD  amitriptyline (ELAVIL) 10 MG tablet Take 10 mg by mouth at bedtime.   Yes Historical Provider, MD  amLODipine (NORVASC) 5 MG tablet Take 1 tablet (5 mg total) by mouth daily. 12/25/13  Yes Jamse Belfast  Meda Coffee, MD  carvedilol (COREG) 12.5 MG tablet Take 12.5 mg by mouth 2 (two) times daily with a meal.   Yes Historical Provider, MD  cephALEXin (KEFLEX) 500 MG capsule Take 500 mg by mouth 4 (four) times daily.   Yes Historical Provider, MD  furosemide (LASIX) 40 MG tablet Take 1 tablet (40 mg total) by mouth daily. 03/02/14  Yes Dorothy Spark, MD  HYDROcodone-acetaminophen (NORCO) 7.5-325 MG per tablet Take 1 tablet by mouth every 6 (six) hours as needed for moderate pain.   Yes Historical Provider, MD  loratadine (CLARITIN) 10 MG tablet Take 10 mg by mouth daily.   Yes Historical Provider, MD  meloxicam (MOBIC) 15 MG tablet Take 15 mg by mouth daily.   Yes Historical Provider, MD  metolazone (ZAROXOLYN) 2.5 MG tablet Take 2.5 mg by mouth  daily.   Yes Historical Provider, MD  pantoprazole (PROTONIX) 40 MG tablet Take 40 mg by mouth daily.   Yes Historical Provider, MD  pravastatin (PRAVACHOL) 20 MG tablet Take 20 mg by mouth daily.   Yes Historical Provider, MD    HOSPITAL MEDICATIONS: I have reviewed the patient's current medications.  VITALS: Blood pressure 97/54, pulse 78, temperature 97.3 F (36.3 C), temperature source Oral, resp. rate 12, height 5\' 7"  (1.702 m), weight 356 lb 4.2 oz (161.6 kg), SpO2 100.00%.  PHYSICAL EXAM: General appearance: alert, cooperative, no distress and morbidly obese Neck: no carotid bruit and no JVD Lungs: few basilar crackles Heart: regular rate and rhythm Abdomen: obese Extremities: 2+ edema Pulses: diminnished Skin: cool, dry Neurologic: Grossly normal Chronic lymphedema, rales at the bases  LABS: Results for orders placed during the hospital encounter of 04/19/14 (from the past 24 hour(s))  GLUCOSE, CAPILLARY     Status: Abnormal   Collection Time    04/19/14  2:05 PM      Result Value Ref Range   Glucose-Capillary 165 (*) 70 - 99 mg/dL  MRSA PCR SCREENING     Status: None   Collection Time    04/19/14  2:08 PM      Result Value Ref Range   MRSA by PCR NEGATIVE  NEGATIVE  GLUCOSE, CAPILLARY     Status: Abnormal   Collection Time    04/19/14  3:08 PM      Result Value Ref Range   Glucose-Capillary 156 (*) 70 - 99 mg/dL  GLUCOSE, CAPILLARY     Status: Abnormal   Collection Time    04/19/14  7:29 PM      Result Value Ref Range   Glucose-Capillary 156 (*) 70 - 99 mg/dL  GLUCOSE, CAPILLARY     Status: Abnormal   Collection Time    04/19/14 11:44 PM      Result Value Ref Range   Glucose-Capillary 168 (*) 70 - 99 mg/dL  CBC     Status: Abnormal   Collection Time    04/20/14  2:18 AM      Result Value Ref Range   WBC 7.1  4.0 - 10.5 K/uL   RBC 4.63  3.87 - 5.11 MIL/uL   Hemoglobin 11.8 (*) 12.0 - 15.0 g/dL   HCT 40.3  36.0 - 46.0 %   MCV 87.0  78.0 - 100.0 fL    MCH 25.5 (*) 26.0 - 34.0 pg   MCHC 29.3 (*) 30.0 - 36.0 g/dL   RDW 15.1  11.5 - 15.5 %   Platelets 257  150 - 400 K/uL  BASIC METABOLIC PANEL  Status: Abnormal   Collection Time    04/20/14  2:18 AM      Result Value Ref Range   Sodium 135 (*) 137 - 147 mEq/L   Potassium 5.1  3.7 - 5.3 mEq/L   Chloride 91 (*) 96 - 112 mEq/L   CO2 34 (*) 19 - 32 mEq/L   Glucose, Bld 135 (*) 70 - 99 mg/dL   BUN 45 (*) 6 - 23 mg/dL   Creatinine, Ser 1.39 (*) 0.50 - 1.10 mg/dL   Calcium 9.1  8.4 - 10.5 mg/dL   GFR calc non Af Amer 41 (*) >90 mL/min   GFR calc Af Amer 48 (*) >90 mL/min   Anion gap 10  5 - 15  MAGNESIUM     Status: None   Collection Time    04/20/14  2:18 AM      Result Value Ref Range   Magnesium 1.8  1.5 - 2.5 mg/dL  PHOSPHORUS     Status: Abnormal   Collection Time    04/20/14  2:18 AM      Result Value Ref Range   Phosphorus 4.8 (*) 2.3 - 4.6 mg/dL  TROPONIN I     Status: None   Collection Time    04/20/14  2:18 AM      Result Value Ref Range   Troponin I <0.30  <0.30 ng/mL  PRO B NATRIURETIC PEPTIDE     Status: Abnormal   Collection Time    04/20/14  2:18 AM      Result Value Ref Range   Pro B Natriuretic peptide (BNP) 1338.0 (*) 0 - 125 pg/mL  GLUCOSE, CAPILLARY     Status: Abnormal   Collection Time    04/20/14  7:49 AM      Result Value Ref Range   Glucose-Capillary 121 (*) 70 - 99 mg/dL    EKG: NSR  IMAGING: Dg Chest Port 1 View  04/20/2014   CLINICAL DATA:  Pulmonary edema  EXAM: PORTABLE CHEST - 1 VIEW  COMPARISON:  04/17/2014  FINDINGS: No change in moderate cardiomegaly. There is upper mediastinal widening which is likely from hypoaeration and portable technique.  Pulmonary venous congestion. No definite interstitial edema. No effusion or pneumothorax. No asymmetric opacity.  IMPRESSION: Cardiomegaly and pulmonary venous congestion.   Electronically Signed   By: Jorje Guild M.D.   On: 04/20/2014 07:02    IMPRESSION: Principal Problem:   Acute on  chronic respiratory failure with hypercapnia Active Problems:   Acute on chronic diastolic congestive heart failure   Diastolic CHF, chronic   Chronic renal insufficiency, stage III (moderate)   CARCINOMA, VULVA   HYPERTENSION   GERD   Lymphedema of lower extremities   Morbid obesity-BMI 56   Dyslipidemia   RECOMMENDATION: Discussed low sodium diet with pt and her husband. She will need formal sleep study as an OP. Follow renal function as she is diuresed, I/O negative 2.4 L so far.   Time Spent Directly with Patient: 45 minutes  Erlene Quan [phone] beeper 04/20/2014, 1:43 PM    The patient was seen, examined and discussed with Kerin Ransom, PA-C and I agree with the above.   58 year old female with h/o vulvar squamous cell cancer that was admitted on 8/16 with worsening SOB and was found to be in COPD exacerbation (acute on chronic respiratory failure with hypercapnia) as well as in CHF exacerbation.   1. Acute on Chronic diastolic congestive heart failure - with DOE, LE edema -  the edema is chronic and has contribution of lympedema post lymphnode resection. Negative 2.5 L and negative 5 lbs since yesterday, stable crea, however still 10 lbs above her baseline (346 lbs in June 2015) - we will switch to iv Lasix 40 mg BID and monitor Crea closely  2. Acute on chronic respiratory failure with hypercapnia - improved on BiPAP - we will discontinue Carvedilol as she has component of bronchospam  3. Hypertension - currently controlled, we are stopping carvedilol (bronchospam) and we will uptitrate amlodipine as needed for BP control Hold ACEI with CKD stage 3  4. Obesity - the patient is very sedentary, almost completely inactive, s/p recent fall, we have referred her to bariatric center in the past. She doesn't seem to be motivated.  5. Chronic lymphedema - slightly improved.   Dorothy Spark 04/20/2014

## 2014-04-21 ENCOUNTER — Inpatient Hospital Stay (HOSPITAL_COMMUNITY): Payer: Medicare Other

## 2014-04-21 ENCOUNTER — Ambulatory Visit: Payer: Medicare Other | Admitting: Cardiology

## 2014-04-21 LAB — GLUCOSE, CAPILLARY
GLUCOSE-CAPILLARY: 128 mg/dL — AB (ref 70–99)
Glucose-Capillary: 80 mg/dL (ref 70–99)
Glucose-Capillary: 88 mg/dL (ref 70–99)
Glucose-Capillary: 123 mg/dL — ABNORMAL HIGH (ref 70–99)

## 2014-04-21 LAB — BASIC METABOLIC PANEL
Anion gap: 11 (ref 5–15)
BUN: 56 mg/dL — AB (ref 6–23)
CALCIUM: 8.8 mg/dL (ref 8.4–10.5)
CO2: 34 mEq/L — ABNORMAL HIGH (ref 19–32)
CREATININE: 1.61 mg/dL — AB (ref 0.50–1.10)
Chloride: 90 mEq/L — ABNORMAL LOW (ref 96–112)
GFR, EST AFRICAN AMERICAN: 40 mL/min — AB (ref >90)
GFR, EST NON AFRICAN AMERICAN: 34 mL/min — AB (ref >90)
GLUCOSE: 120 mg/dL — AB (ref 70–99)
Potassium: 3.8 mEq/L (ref 3.7–5.3)
Sodium: 135 mEq/L — ABNORMAL LOW (ref 137–147)

## 2014-04-21 LAB — URINE CULTURE
COLONY COUNT: NO GROWTH
CULTURE: NO GROWTH

## 2014-04-21 MED ORDER — FUROSEMIDE 10 MG/ML IJ SOLN: 40.0000 mg | Freq: Every day | INTRAMUSCULAR | Status: DC

## 2014-04-21 MED ORDER — FUROSEMIDE 40 MG PO TABS
40.0000 mg | ORAL_TABLET | Freq: Every day | ORAL | Status: DC
  Administered 2014-04-22 – 2014-04-23 (×2): 40 mg via ORAL
  Filled 2014-04-21 (×2): qty 1

## 2014-04-21 NOTE — Progress Notes (Signed)
SATURATION QUALIFICATIONS: (This note is used to comply with regulatory documentation for home oxygen)  Patient Saturations on Room Air at Rest = 88%  Patient Saturations on Room Air while Ambulating = %  Patient Saturations on 2 Liters of oxygen while Ambulating =97%  Please briefly explain why patient needs home oxygen: CHF

## 2014-04-21 NOTE — Progress Notes (Signed)
Patient taken off BiPap for the morning.  Placed on 2l [place].  Patient tolerated well.

## 2014-04-21 NOTE — Progress Notes (Signed)
PULMONARY / CRITICAL CARE MEDICINE   Name: [name] [name] MRN: [id number] DOB: 12-07-1955    ADMISSION DATE:  04/19/2014  INITIAL PRESENTATION:  15 F admitted to ICU in transfer from [facility] with acute on chronic hypercarbic resp failure - multifactorial. Arrived on BiPAP. Tolerated [place] O2 initially after arrival  STUDIES:  Echo 8/18>>>  SIGNIFICANT EVENTS: 8/17 no distress, sats excellent  SUBJECTIVE: did well with BIPAP, neg 4 liters  VITAL SIGNS: Temp:  [97.3 F (36.3 C)-98.7 F (37.1 C)] 98 F (36.7 C) (08/18 0809) Pulse Rate:  [71-91] 90 (08/18 1000) Resp:  [12-27] 17 (08/18 1000) BP: (92-124)/(45-67) 118/60 mmHg (08/18 1000) SpO2:  [80 %-100 %] 80 % (08/18 1000) FiO2 (%):  [4 %-40 %] 40 % (08/18 0400) Weight:  [160.8 kg (354 lb 8 oz)] 160.8 kg (354 lb 8 oz) (08/18 0400) HEMODYNAMICS:   VENTILATOR SETTINGS: Vent Mode:  [-] BIPAP FiO2 (%):  [4 %-40 %] 40 % Set Rate:  [15 bmp] 15 bmp PEEP:  [5 cmH20] 5 cmH20 INTAKE / OUTPUT:  Intake/Output Summary (Last 24 hours) at 04/21/14 1153 Last data filed at 04/21/14 0900  Gross per 24 hour  Intake    560 ml  Output   4629 ml  Net  -4069 ml    PHYSICAL EXAMINATION: General: Very obese, pleasant, alert, appropriate, no distress Neuro:  Calm, appropriate in, nonfocal HEENT: obese Cardiovascular: s1 s2 RR distant Lungs: Diminished BS Abdomen:  Obese, soft, NT, nd Ext: 3+ symmetric pretibial edema with chronic stasis changes - no changes  LABS: From [place]: 7.24/89/88 Na 133 K5.9 Cr 1.51 Hgb 12.1 Glu 154 Trop I normal  CBC  Recent Labs Lab 04/20/14 0218  WBC 7.1  HGB 11.8*  HCT 40.3  PLT 257   Coag's No results found for this basename: APTT, INR,  in the last 168 hours BMET  Recent Labs Lab 04/20/14 0218 04/20/14 1949 04/21/14 0244  NA 135* 134* 135*  K 5.1 5.8* 3.8  CL 91* 89* 90*  CO2 34* 32 34*  BUN 45* 53* 56*  CREATININE 1.39* 1.55* 1.61*  GLUCOSE 135* 115* 120*    Electrolytes  Recent Labs Lab 04/20/14 0218 04/20/14 1949 04/21/14 0244  CALCIUM 9.1 9.3 8.8  MG 1.8  --   --   PHOS 4.8*  --   --    Sepsis Markers No results found for this basename: LATICACIDVEN, PROCALCITON, O2SATVEN,  in the last 168 hours ABG No results found for this basename: PHART, PCO2ART, PO2ART,  in the last 168 hours Liver Enzymes No results found for this basename: AST, ALT, ALKPHOS, BILITOT, ALBUMIN,  in the last 168 hours Cardiac Enzymes  Recent Labs Lab 04/20/14 0218  TROPONINI <0.30  PROBNP 1338.0*   Glucose  Recent Labs Lab 04/19/14 2344 04/20/14 0749 04/20/14 1243 04/20/14 1603 04/20/14 1935 04/21/14 0807  GLUCAP 168* 121* 123* 103* 112* 80    Imaging Dg Chest Port 1 View  04/20/2014   CLINICAL DATA:  Pulmonary edema  EXAM: PORTABLE CHEST - 1 VIEW  COMPARISON:  04/17/2014  FINDINGS: No change in moderate cardiomegaly. There is upper mediastinal widening which is likely from hypoaeration and portable technique.  Pulmonary venous congestion. No definite interstitial edema. No effusion or pneumothorax. No asymmetric opacity.  IMPRESSION: Cardiomegaly and pulmonary venous congestion.   Electronically Signed   By: Jorje Guild M.D.   On: 04/20/2014 07:02   CXR:  from [place] - mild to mod interstitial porminence  ASSESSMENT / PLAN:  PULMONARY A: Acute on chronic hypercarbic/hypoxemic resp failure Former smoker, recurrent bronchitis, suspect COPD AECOPD with mild bronchospasm Suspect mild pulm edema - main issue diastolic acute HF Likely OHS Suspect OSA pulm htn P:   Supplemental O2 to maintain SpO2 88-92% Nebulized steroids and BDs Lasix IV continued but reduce Mandatory BiPAP @ HS - has used well multiple nights  CARDIOVASCULAR A:  Chest pain - doubt AMI. Initial TpI negative Grade 2 diastolic HF Hypertension Hyperlipidemia P:  Cont carvedilol, amlodipine, statin Echo repeat pending BIPAP nocturnal Lasix to reduce  given crt rise and neg 4.1 liters  RENAL A:   CKD - baseline Cr approx 1.5, rise on lasix Severe hypervolemia - likely due to R sided failure and lymphedema Hyperkalemia (5.9 @ [place]) - resolved P:   Lasix reduction Chem in am  kvo  GASTROINTESTINAL A:   Morbid obesity P:   Carb modified diet Nutritional counseling  HEMATOLOGIC A:   No issues P:  DVT px: enoxaparin Monitor CBC intermittently  INFECTIOUS A:   Acute bronchitis P:   Levofloxacin 8/16 >>8/17 observe  ENDOCRINE A:  Hyperglycemia without prior dx of DM   P:   Check HgB A1C - pending SSI - mod scale with HS coverage and meal coverage  NEUROLOGIC A:   No issues P:   RASS goal: 0 Cont PRN amitriptyline qHS PRN for sleep Avoid opioids due to hypercarbia Pt consult  RHEUM A; Osteoarthritis Gout P: Cont allopurinol, low threshold to dc this also Holding meloxicam  TODAY'S SUMMARY:reduce lasix, to floor  I have personally obtained a history, examined the patient, evaluated laboratory and imaging results, formulated the assessment and plan and placed orders.  Lavon Paganini. Titus Mould, MD, [place] Pgr: [place] [facility]

## 2014-04-21 NOTE — Progress Notes (Signed)
Patient Name: [name] [name] Date of Encounter: 04/21/2014  Principal Problem:   Acute on chronic respiratory failure with hypercapnia Active Problems:   CARCINOMA, VULVA   HYPERTENSION   GERD   Lymphedema of lower extremities   Morbid obesity-BMI 56   Diastolic CHF, chronic   Acute on chronic diastolic congestive heart failure   Chronic renal insufficiency, stage III (moderate)   Dyslipidemia   Acute on chronic diastolic CHF (congestive heart failure), NYHA class 3   Length of Stay: 2  SUBJECTIVE  The patient feels significantly better since the admission.   CURRENT MEDS . allopurinol  300 mg Oral Daily  . amLODipine  5 mg Oral Daily  . antiseptic oral rinse  7 mL Mouth Rinse BID  . budesonide  0.25 mg Nebulization Q6H  . enoxaparin (LOVENOX) injection  80 mg Subcutaneous Q24H  . [START ON 04/22/2014] furosemide  40 mg Intravenous Daily  . insulin aspart  0-15 Units Subcutaneous TID WC  . insulin aspart  0-5 Units Subcutaneous QHS  . insulin aspart  4 Units Subcutaneous TID WC  . ipratropium-albuterol  3 mL Nebulization Q6H  . pantoprazole  40 mg Oral Q1200  . simvastatin  20 mg Oral q1800  . sodium chloride  3 mL Intravenous Q12H   OBJECTIVE  Filed Vitals:   04/21/14 0900 04/21/14 1000 04/21/14 1100 04/21/14 1200  BP: 109/49 118/60 92/51 104/57  Pulse: 83 90 83 90  Temp:    98.1 F (36.7 C)  TempSrc:    Oral  Resp: 21 17 14 18   Height:      Weight:      SpO2: 100% 80% 100% 98%    Intake/Output Summary (Last 24 hours) at 04/21/14 1317 Last data filed at 04/21/14 1200  Gross per 24 hour  Intake    420 ml  Output   5476 ml  Net  -5056 ml   Filed Weights   04/19/14 2200 04/20/14 0500 04/21/14 0400  Weight: 361 lb 1.8 oz (163.8 kg) 356 lb 4.2 oz (161.6 kg) 354 lb 8 oz (160.8 kg)    PHYSICAL EXAM  General: Pleasant, NAD. Neuro: Alert and oriented X 3. Moves all extremities spontaneously. Psych: Normal affect. HEENT:  Normal  Neck: Supple without  bruits or JVD. Lungs:  Resp regular and unlabored, CTA. Heart: RRR no s3, s4, or murmurs. Abdomen: Soft, non-tender, non-distended, BS + x 4.  Extremities: No clubbing, cyanosis or edema. DP/PT/Radials 2+ and equal bilaterally.  Accessory Clinical Findings  CBC  Recent Labs  04/20/14 0218  WBC 7.1  HGB 11.8*  HCT 40.3  MCV 87.0  PLT 875   Basic Metabolic Panel  Recent Labs  04/20/14 0218 04/20/14 1949 04/21/14 0244  NA 135* 134* 135*  K 5.1 5.8* 3.8  CL 91* 89* 90*  CO2 34* 32 34*  GLUCOSE 135* 115* 120*  BUN 45* 53* 56*  CREATININE 1.39* 1.55* 1.61*  CALCIUM 9.1 9.3 8.8  MG 1.8  --   --   PHOS 4.8*  --   --    Liver Function Tests No results found for this basename: AST, ALT, ALKPHOS, BILITOT, PROT, ALBUMIN,  in the last 72 hours No results found for this basename: LIPASE, AMYLASE,  in the last 72 hours Cardiac Enzymes  Recent Labs  04/20/14 0218  TROPONINI <0.30   Hemoglobin A1C  Recent Labs  04/20/14 0218  HGBA1C 6.9*   Radiology/Studies  Dg Chest Port 1 View  04/21/2014  CLINICAL DATA:  Edema.  EXAM: PORTABLE CHEST - 1 VIEW  COMPARISON:  April 20, 2014.  FINDINGS: Stable cardiomegaly and central pulmonary vascular congestion is noted. No pneumothorax or significant pleural effusion is noted. Bony thorax is intact.  IMPRESSION: Stable cardiomegaly and central pulmonary vascular congestion.   Electronically Signed   By: Sabino Dick M.D.   On: 04/21/2014 07:36   Dg Chest Port 1 View  04/20/2014   CLINICAL DATA:  Pulmonary edema  EXAM: PORTABLE CHEST - 1 VIEW  COMPARISON:  04/17/2014  FINDINGS: No change in moderate cardiomegaly. There is upper mediastinal widening which is likely from hypoaeration and portable technique.  Pulmonary venous congestion. No definite interstitial edema. No effusion or pneumothorax. No asymmetric opacity.  IMPRESSION: Cardiomegaly and pulmonary venous congestion.   Electronically Signed   By: Jorje Guild M.D.   On:  04/20/2014 07:02    TELE: SR, PVCs     ASSESSMENT AND PLAN  58 year old female with h/o vulvar squamous cell cancer that was admitted on 8/16 with worsening SOB and was found to be in COPD exacerbation (acute on chronic respiratory failure with hypercapnia) as well as in CHF exacerbation.   1. Acute on Chronic diastolic congestive heart failure - with DOE, chronic LE edema - the edema is chronic and has contribution of lympedema post lymphnode resection. Negative 5 L since yesterday, stable crea, however still 10 lbs above her baseline (346 lbs in June 2015). Increasing Crea 1.39 --> 1.61 (baseline 1.4). - we will switch to PO Lasix 40 mg daily and monitor Crea closely   2. Acute on chronic respiratory failure with hypercapnia - improved on BiPAP  - we will discontinue Carvedilol as she has component of bronchospam   3. Hypertension - currently controlled, we are stopping carvedilol (bronchospam) and we will uptitrate amlodipine as needed for BP control  Hold ACEI with CKD stage 3   4. Obesity - the patient is very sedentary, almost completely inactive, s/p recent fall, we have referred her to bariatric center in the past, she didn't follow as medicaid wouldn't cover, but medicare would. She is encouraged to follow. She will also need to be evaluated by PT here and be referred to outpatient rehab and dietitian classes as she is completely inactive.   5. Chronic lymphedema - slightly improved.    Signed, Dorothy Spark MD, [facility] 04/21/2014

## 2014-04-22 DIAGNOSIS — I509 Heart failure, unspecified: Secondary | ICD-10-CM

## 2014-04-22 LAB — GLUCOSE, CAPILLARY
GLUCOSE-CAPILLARY: 158 mg/dL — AB (ref 70–99)
Glucose-Capillary: 110 mg/dL — ABNORMAL HIGH (ref 70–99)
Glucose-Capillary: 103 mg/dL — ABNORMAL HIGH (ref 70–99)
Glucose-Capillary: 109 mg/dL — ABNORMAL HIGH (ref 70–99)

## 2014-04-22 LAB — BASIC METABOLIC PANEL
ANION GAP: 9 (ref 5–15)
BUN: 46 mg/dL — ABNORMAL HIGH (ref 6–23)
CO2: 37 meq/L — AB (ref 19–32)
Calcium: 9.3 mg/dL (ref 8.4–10.5)
Chloride: 91 mEq/L — ABNORMAL LOW (ref 96–112)
Creatinine, Ser: 1.27 mg/dL — ABNORMAL HIGH (ref 0.50–1.10)
GFR calc non Af Amer: 46 mL/min — ABNORMAL LOW (ref >90)
GFR, EST AFRICAN AMERICAN: 53 mL/min — AB (ref >90)
Glucose, Bld: 119 mg/dL — ABNORMAL HIGH (ref 70–99)
POTASSIUM: 4 meq/L (ref 3.7–5.3)
Sodium: 137 mEq/L (ref 137–147)

## 2014-04-22 LAB — MAGNESIUM: MAGNESIUM: 1.8 mg/dL (ref 1.5–2.5)

## 2014-04-22 LAB — APTT: APTT: 28 s (ref 24–37)

## 2014-04-22 MED ORDER — IPRATROPIUM-ALBUTEROL 0.5-2.5 (3) MG/3ML IN SOLN: 3.0000 mL | RESPIRATORY_TRACT | Status: DC | PRN

## 2014-04-22 MED ORDER — LIVING WELL WITH DIABETES BOOK
Freq: Once | Status: AC
  Administered 2014-04-22: 13:00:00
  Filled 2014-04-22: qty 1

## 2014-04-22 MED ORDER — BUDESONIDE-FORMOTEROL FUMARATE 160-4.5 MCG/ACT IN AERO
2.0000 | INHALATION_SPRAY | Freq: Two times a day (BID) | RESPIRATORY_TRACT | Status: DC
  Administered 2014-04-22 – 2014-04-23 (×2): 2 via RESPIRATORY_TRACT
  Filled 2014-04-22: qty 6

## 2014-04-22 NOTE — Progress Notes (Signed)
Pt states she will BIPAP later tonight around 2300. RT will continue to monitor.

## 2014-04-22 NOTE — Progress Notes (Signed)
*  PRELIMINARY RESULTS* Echocardiogram 2D Echocardiogram has been performed.  Leavy Cella 04/22/2014, 1:48 PM

## 2014-04-22 NOTE — Progress Notes (Signed)
Came in to administer Tx, patient said that she couldn't tolerate the BiPAP, and was back on 2lpm 02.

## 2014-04-22 NOTE — Discharge Summary (Signed)
Physician Discharge Summary  Patient ID: [name] [name] MRN: [id number] DOB/AGE: 57-Jul-1957 57 y.o.  Admit date: 04/19/2014 Discharge date: 04/23/2014    Discharge Diagnoses:  Principal Problem:   Acute on chronic respiratory failure with hypercapnia Active Problems:   CARCINOMA, VULVA   HYPERTENSION   GERD   Lymphedema of lower extremities   Morbid obesity-BMI 56   Diastolic CHF, chronic   Acute on chronic diastolic congestive heart failure   Chronic renal insufficiency, stage III (moderate)   Dyslipidemia   Acute on chronic diastolic CHF (congestive heart failure), NYHA class 3   Type II or unspecified type diabetes mellitus without mention of complication, uncontrolled    Brief Summary: [name] [name] is a 58 y.o. y/o obese female with a PMH of diastolic CHF and chronic DOE, HTN, lymphedema who presented to [facility] with 2-3 week hx worsening exertional dyspnea and intermittent CP.  Initial ABG revealed PCO2 of 89 and pH 7.24.  She was started on NIPPV and tx to [facility].  She was diuresed aggressively with quick response and was able to transition off bipap.  She was also treated for suspected COPD +/- acute bronchitis with nebulized BDs and nebulized steroids, supplemental O2 and levaquin.  She was followed closely by cardiology for acute on chronic diastolic CHF and ultimately was transitioned to PO lasix.  Course was c/b acute on CKD, improved with reduction in lasix as well as hyperglycemia and new dx DM.  She has improved with diuresis, O2 and Bronchodilators and is near baseline and ready for d/c pending close outpt pulmonary and cardiology f/u.  She will be d/c on mandatory qhs bipap with outpt sleep study pending.  She will also be d/c with home O2.    Consults: Cardiology   Lines/tubes: none   Microbiology/Sepsis markers: Urine 8/16>>>neg   Significant Diagnostic Studies:  2D echo 8/19>>>EF 22-97%, normal systolic function, trivial TR                                                         D/C Plan by Discharge Diagnosis  58 yo female with former smoker with progressive dyspnea and acute on chronic hypoxic/hypercapnic respiratory failure 2nd to acute on chronic diastolic dysfx, presumed COPD, and presumed sleep disordered breathing (OSA +/- OHS).  Acute on chronic hypercarbic/hypoxemic resp failure  Former smoker, recurrent bronchitis, presumed COPD  Suspect OHS/OSA  D/c plan --  Home O2  Mandatory qhs bipap  outpt sleep study  outpt pulm f/u ?COPD with PFT's  symbicort  PRN albuterol    Acute on chronic Grade 2 diastolic HF  Hypertension  Hyperlipidemia D/c plan --  outpt cards f/u Cont po lasix, norvasc, statin  F/u chem as outpt  Nocturnal bipap  Weight loss - bariatric referral, nutrition counseling provided   CKD - baseline Cr approx 1.3  Severe hypervolemia - likely due to R sided failure and lymphedema D/c plan --  Lasix as above  Outpt f/u chem   Morbid obesity  D/c plan --  Nutrition counseling provided  Low Na carb mod diet  Bariatric referral   DM - new dx.  HbA1C 6.9 from 8/17.  Seen by Diabetes educator  D/c plan --  Metformin  Home glucose testing kit  outpt referral DM education made  Filed Vitals:   04/22/14 2022 04/22/14 2116 04/23/14 0348 04/23/14 0730  BP:  119/53 135/72   Pulse:  92 98   Temp:  98.9 F (37.2 C) 98.8 F (37.1 C)   TempSrc:  Oral Oral   Resp:  18 20   Height:      Weight:   338 lb 6.5 oz (153.5 kg)   SpO2: 97% 93% 92% 97%     Discharge Labs  BMET  Recent Labs Lab 04/20/14 0218 04/20/14 1949 04/21/14 0244 04/22/14 0356  NA 135* 134* 135* 137  K 5.1 5.8* 3.8 4.0  CL 91* 89* 90* 91*  CO2 34* 32 34* 37*  GLUCOSE 135* 115* 120* 119*  BUN 45* 53* 56* 46*  CREATININE 1.39* 1.55* 1.61* 1.27*  CALCIUM 9.1 9.3 8.8 9.3  MG 1.8  --   --  1.8  PHOS 4.8*  --   --   --      CBC   Recent Labs Lab 04/20/14 0218  HGB 11.8*  HCT 40.3  WBC  7.1  PLT 257   Anti-Coagulation No results found for this basename: INR,  in the last 168 hours   Discharge Instructions   Ambulatory referral to Nutrition and Diabetic Education    Complete by:  As directed   New diagnosis of DM.  A1c 6.9%.  Lives in [place].  Please wait and call patient for appointment after discharge.  Thanks!  Patient: Please call the [facility] after discharge to schedule an appointment for diabetes education if you do not hear from the center before discharge  [phone]     Call MD for:  difficulty breathing, headache or visual disturbances    Complete by:  As directed      Call MD for:  persistant dizziness or light-headedness    Complete by:  As directed      Diet - low sodium heart healthy    Complete by:  As directed      Diet Carb Modified    Complete by:  As directed      Discharge instructions    Complete by:  As directed   Continuous oxygen 2L nasal cannula  Bipap EVERY night     Increase activity slowly    Complete by:  As directed                 Follow-up Information   Follow up with [facility] On 06/12/2014. (8:00PM ; will leave between 5:30am and 6:00am the following morning.)    Contact information:   [address] [place] [address] [phone]      Follow up with Tula Nakayama On 04/28/2014. (9:30am )    Specialty:  Family Medicine   Contact information:   [address] [place] [address] [phone]       Follow up with Dorothy Spark, MD On 05/08/2014. (10:45am )    Specialty:  Cardiology   Contact information:   [place] [id number] [phone]       Follow up with Dorreen Valiente, MD On 05/21/2014. (10:30am )    Specialty:  Pulmonary Disease   Contact information:   [address] [place] [address] [phone]       Follow up with [facility] On 05/14/2014. (12:00 noon -  pulmonary function tests )    Contact information:   [address]  [place] [phone]          Medication List    STOP taking these medications       carvedilol 12.5 MG tablet  Commonly known as:  COREG     cephALEXin 500 MG capsule  Commonly known as:  KEFLEX     HYDROcodone-acetaminophen 7.5-325 MG per tablet  Commonly known as:  NORCO     meloxicam 15 MG tablet  Commonly known as:  MOBIC     metolazone 2.5 MG tablet  Commonly known as:  ZAROXOLYN      TAKE these medications       albuterol (2.5 MG/3ML) 0.083% nebulizer solution  Commonly known as:  PROVENTIL  Take 2.5 mg by nebulization every 6 (six) hours as needed for wheezing or shortness of breath.     allopurinol 300 MG tablet  Commonly known as:  ZYLOPRIM  Take 300 mg by mouth daily.     amitriptyline 10 MG tablet  Commonly known as:  ELAVIL  Take 10 mg by mouth at bedtime.     amLODipine 5 MG tablet  Commonly known as:  NORVASC  Take 1 tablet (5 mg total) by mouth daily.     blood glucose meter kit and supplies  Dispense based on patient and insurance preference. Use up to four times daily as directed. (FOR ICD-9 250.00, 250.01).     budesonide-formoterol 160-4.5 MCG/ACT inhaler  Commonly known as:  SYMBICORT  Inhale 2 puffs into the lungs 2 (two) times daily.     furosemide 40 MG tablet  Commonly known as:  LASIX  Take 1 tablet (40 mg total) by mouth daily.     loratadine 10 MG tablet  Commonly known as:  CLARITIN  Take 10 mg by mouth daily.     metFORMIN 500 MG tablet  Commonly known as:  GLUCOPHAGE  Take 1 tablet (500 mg total) by mouth 2 (two) times daily with a meal.     pantoprazole 40 MG tablet  Commonly known as:  PROTONIX  Take 40 mg by mouth daily.     pravastatin 20 MG tablet  Commonly known as:  PRAVACHOL  Take 20 mg by mouth daily.          Disposition: Home   Discharged Condition: TONILYNN BIEKER has met maximum benefit of inpatient care and is medically  stable and cleared for discharge.  Patient is pending follow up as above.      Time spent on disposition:  Greater than 35 minutes.   SignedDarlina Sicilian, NP 04/23/2014  10:44 AM Pager: [phone] or [phone]  *Care during the described time interval was provided by me and/or other providers on the critical care team. I have reviewed this patient's available data, including medical history, events of note, physical examination and test results as part of my evaluation.      Chesley Mires, MD [facility] Pulmonary/Critical Care 04/23/2014, 5:20 PM Pager:  [phone] After 3pm call: [phone]

## 2014-04-22 NOTE — Progress Notes (Signed)
SATURATION QUALIFICATIONS: (This note is used to comply with regulatory documentation for home oxygen)  Patient Saturations on Room Air at Rest = 85%  Patient Saturations on Room Air while Ambulating = 84%  Patient Saturations on 2L Liters of oxygen while Ambulating = 90%  Please briefly explain why patient needs home oxygen: Walking on 3L pts saturations went up to 91-93% Once pt was back to room at rest O2 was 91% on 2L, place pt on 3L of O2 and saturations went up to 94%  [name], Marsh Dolly, RN

## 2014-04-22 NOTE — Progress Notes (Signed)
Pt refuses IV restart; pt states she will be going home today and does not want another IV.  Rowe Pavy, RN

## 2014-04-22 NOTE — Progress Notes (Signed)
Inpatient Diabetes Program Recommendations  AACE/ADA: New Consensus Statement on Inpatient Glycemic Control (2013)  Target Ranges:  Prepandial:   less than 140 mg/dL      Peak postprandial:   less than 180 mg/dL (1-2 hours)      Critically ill patients:  140 - 180 mg/dL     Results for [name] [name], [name] [name] (MRN [id number]) as of 04/22/2014 15:08  Ref. Range 04/21/2014 08:07 04/21/2014 12:29 04/21/2014 16:34 04/21/2014 21:35  Glucose-Capillary Latest Range: 70-99 mg/dL 80 88 123 (H) 128 (H)    Results for [name] [name], [name] [name] (MRN [id number]) as of 04/22/2014 15:08  Ref. Range 04/20/2014 02:18  Hemoglobin A1C Latest Range: <5.7 % 6.9 (H)    Admitted with Respiratory issues.  Diagnosed with DM this admission as evidenced by A1c of 6.9%.     **Spoke with pt about new diagnosis.  Discussed A1C results with her and explained what an A1C is, basic pathophysiology of DM Type 2, basic home care, importance of checking CBGs and maintaining good CBG control to prevent long-term and short-term complications.  RNs to provide ongoing basic DM education at bedside with this patient.  Have ordered educational booklet and DM videos.  **Discussed basic nutritional concepts with patient regarding blood sugar control.  Encouraged patient to avoid beverages with sugar (regular sodas, sweet tea, fruit juice, etc) and encouraged patient to be mindful of her portion sizes of carbohydrate containing foods.  Reviewed basic foods groups that contain carbohydrates and encouraged patient to eat more fresh/frozen vegetables as possible.  **Patient stated her husband and her son both have DM and that she has learned a lot about DM from them.  Patient stated "I just need to do this."  Encouraged patient to attend follow-up DM classes after discharge.  Have placed referral in the computer.   MD- Please make sure patient is given a Rx for Glucometer and supplies at time of discharge (Order # [id number])  May want to start patient on low dose  Metformin at discharge- Metformin 500 mg bid    Will follow Wyn Quaker RN, MSN, CDE Diabetes Coordinator Inpatient Diabetes Program Team Pager: [phone] (8a-10p)

## 2014-04-22 NOTE — Progress Notes (Signed)
Patient Name: [name] [name] Date of Encounter: 04/22/2014     Principal Problem:   Acute on chronic respiratory failure with hypercapnia Active Problems:   CARCINOMA, VULVA   HYPERTENSION   GERD   Lymphedema of lower extremities   Morbid obesity-BMI 56   Diastolic CHF, chronic   Acute on chronic diastolic congestive heart failure   Chronic renal insufficiency, stage III (moderate)   Dyslipidemia   Acute on chronic diastolic CHF (congestive heart failure), NYHA class 3    SUBJECTIVE  Denies any CP or SOB. She was able to lay flat today, first time since admitted.  CURRENT MEDS . allopurinol  300 mg Oral Daily  . amLODipine  5 mg Oral Daily  . antiseptic oral rinse  7 mL Mouth Rinse BID  . budesonide  0.25 mg Nebulization Q6H  . enoxaparin (LOVENOX) injection  80 mg Subcutaneous Q24H  . furosemide  40 mg Oral Daily  . insulin aspart  0-15 Units Subcutaneous TID WC  . insulin aspart  0-5 Units Subcutaneous QHS  . insulin aspart  4 Units Subcutaneous TID WC  . ipratropium-albuterol  3 mL Nebulization Q6H  . pantoprazole  40 mg Oral Q1200  . simvastatin  20 mg Oral q1800  . sodium chloride  3 mL Intravenous Q12H    OBJECTIVE  Filed Vitals:   04/21/14 2131 04/22/14 0101 04/22/14 0507 04/22/14 0731  BP: 112/56  135/73   Pulse: 92 88 97   Temp: 98.2 F (36.8 C)  98.8 F (37.1 C)   TempSrc: Oral  Oral   Resp: 18 18 17    Height:      Weight:   349 lb 10.4 oz (158.6 kg)   SpO2: 93% 97% 90% 95%    Intake/Output Summary (Last 24 hours) at 04/22/14 0923 Last data filed at 04/22/14 0600  Gross per 24 hour  Intake    340 ml  Output   3935 ml  Net  -3595 ml   Filed Weights   04/20/14 0500 04/21/14 0400 04/22/14 0507  Weight: 356 lb 4.2 oz (161.6 kg) 354 lb 8 oz (160.8 kg) 349 lb 10.4 oz (158.6 kg)    PHYSICAL EXAM  General: Pleasant, NAD. Morbidly obese Neuro: Alert and oriented X 3. Moves all extremities spontaneously. Psych: Normal affect. HEENT:   Normal  Neck: Supple without bruits. Unable to assess JVD with excessive neck tissue Lungs:  Resp regular and unlabored, mildly decreased breath sound in RLL, otherwise, CTA Heart: RRR no s3, s4, or murmurs. Abdomen: Soft, non-tender, non-distended, BS + x 4.  Extremities: No clubbing, cyanosis. DP/PT/Radials 2+ and equal bilaterally. 1-2+ edema in bilateral LE  Accessory Clinical Findings  CBC  Recent Labs  04/20/14 0218  WBC 7.1  HGB 11.8*  HCT 40.3  MCV 87.0  PLT 026   Basic Metabolic Panel  Recent Labs  04/20/14 0218  04/21/14 0244 04/22/14 0356  NA 135*  < > 135* 137  K 5.1  < > 3.8 4.0  CL 91*  < > 90* 91*  CO2 34*  < > 34* 37*  GLUCOSE 135*  < > 120* 119*  BUN 45*  < > 56* 46*  CREATININE 1.39*  < > 1.61* 1.27*  CALCIUM 9.1  < > 8.8 9.3  MG 1.8  --   --  1.8  PHOS 4.8*  --   --   --   < > = values in this interval not displayed. Cardiac Enzymes  Recent  Labs  04/20/14 0218  TROPONINI <0.30   Hemoglobin A1C  Recent Labs  04/20/14 0218  HGBA1C 6.9*    TELE NSR with HR low 90s, no significant ventricular ectopy    ECG  8/16 NSR with HR 80s, no significant ST-T wave changes  Echocardiogram  LV EF: 55%  ------------------------------------------------------------ Indications: Dyspnea 786.09.  ------------------------------------------------------------ History: PMH: Acquired from the patient and from the patient's chart. Dyspnea and bilateral lower extremity edema. Risk factors: Hypertension. Morbidly obese. Dyslipidemia.  ------------------------------------------------------------ Study Conclusions  - Left ventricle: The cavity size was normal. Wall thickness was increased in a pattern of mild LVH. The estimated ejection fraction was 55%. Wall motion was normal; there were no regional wall motion abnormalities. Features are consistent with a pseudonormal left ventricular filling pattern, with concomitant abnormal relaxation  and increased filling pressure (grade 2 diastolic dysfunction). - Aortic valve: There was no stenosis. - Mitral valve: No significant regurgitation. - Left atrium: The atrium was mildly dilated. - Right ventricle: The cavity size was normal. Systolic function was normal. - Pulmonary arteries: PA peak pressure: 75mm Hg (S). - Systemic veins: IVC measured 2.3 cm with some respirophasic variation, suggesting RA pressure 10 mmHg. Impressions:  - Normal LV size and systolic function, EF 92%. Mild LV hypertrophy. Moderate diastolic dysfunction. Normal RV size and systolic function. Mild pulmonary hypertension. No significant valvular abnormalities.      Radiology/Studies  Dg Chest 2 View  04/17/2014   CLINICAL DATA:  Short of breath since knee replacement in May 2015. Worsening recently.  EXAM: CHEST  2 VIEW  COMPARISON:  02/17/2014.  07/01/2013.  FINDINGS: The heart is mildly enlarged. There is venous hypertension with mild interstitial edema and fluid in the fissures. No consolidation or collapse. No significant bony finding.  IMPRESSION: Most consistent with low level congestive heart failure. Cardiomegaly, venous hypertension, mild interstitial edema and fluid in the fissures.   Electronically Signed   By: Nelson Chimes M.D.   On: 04/17/2014 09:19   Dg Chest Port 1 View  04/21/2014   CLINICAL DATA:  Edema.  EXAM: PORTABLE CHEST - 1 VIEW  COMPARISON:  April 20, 2014.  FINDINGS: Stable cardiomegaly and central pulmonary vascular congestion is noted. No pneumothorax or significant pleural effusion is noted. Bony thorax is intact.  IMPRESSION: Stable cardiomegaly and central pulmonary vascular congestion.   Electronically Signed   By: Sabino Dick M.D.   On: 04/21/2014 07:36   Dg Chest Port 1 View  04/20/2014   CLINICAL DATA:  Pulmonary edema  EXAM: PORTABLE CHEST - 1 VIEW  COMPARISON:  04/17/2014  FINDINGS: No change in moderate cardiomegaly. There is upper mediastinal widening which is  likely from hypoaeration and portable technique.  Pulmonary venous congestion. No definite interstitial edema. No effusion or pneumothorax. No asymmetric opacity.  IMPRESSION: Cardiomegaly and pulmonary venous congestion.   Electronically Signed   By: Jorje Guild M.D.   On: 04/20/2014 07:02    ASSESSMENT AND PLAN  1. Acute on chronic distolic CHF  - Echo Nov 0100 EF 71%, grade 2 diastolic dysfunction  - baseline weight 346 in June 2015, chronic LE edema 2/2 lymphedema post lymph node resection  - continue PO lasix 40mg  daily, -10L. Weight today almost back to baseline 349, only 3 lbs over weight  - expect discharge later today or tomorrow  2. Acute on chronic rep failure  - coreg discontinued due to bronchospasm  3. HTN 4. Obesity: need bariatric followup  5. Chronic lymphedema 6. CKD,  no ACEI  Signed, Almyra Deforest PA-C Pager: [phone]  Personally seen and examined. Agree with above. Candee Furbish, MD

## 2014-04-22 NOTE — Evaluation (Signed)
Physical Therapy Evaluation Patient Details Name: [name] [name] MRN: [id number] DOB: [birth year] Today's Date: 04/22/2014   History of Present Illness  Pt adm with respiratory failure and required bipap. PMH - morbid obesity, Rt TKA 01/2014  Clinical Impression  Pt doing well with mobility and will be able to return home with husband when medically ready. Will follow acutely with PT to maximize independence and quality of life.    Follow Up Recommendations No PT follow up    Equipment Recommendations  None recommended by PT    Recommendations for Other Services       Precautions / Restrictions Precautions Precautions: None      Mobility  Bed Mobility                  Transfers Overall transfer level: Needs assistance Equipment used: Rolling walker (2 wheeled) Transfers: Sit to/from Stand Sit to Stand: Min assist         General transfer comment: Assist to bring hips up from low chair. Pt uses rocking momentum from low recliner.  Ambulation/Gait Ambulation/Gait assistance: Supervision Ambulation Distance (Feet): 200 Feet Assistive device: Rolling walker (2 wheeled) Gait Pattern/deviations: Step-through pattern;Decreased step length - right;Decreased step length - left Gait velocity: decr Gait velocity interpretation: Below normal speed for age/gender General Gait Details: Pt amb on 2L of O2.  Stairs            Wheelchair Mobility    Modified Rankin (Stroke Patients Only)       Balance Overall balance assessment: Needs assistance Sitting-balance support: No upper extremity supported;Feet supported Sitting balance-Leahy Scale: Good     Standing balance support: Bilateral upper extremity supported Standing balance-Leahy Scale: Poor Standing balance comment: Using walker for support.                             Pertinent Vitals/Pain Pain Assessment: No/denies pain    Home Living Family/patient expects to be discharged to::  Private residence Living Arrangements: Spouse/significant other Available Help at Discharge: Family Type of Home: House Home Access: Stairs to enter Entrance Stairs-Rails: Right Entrance Stairs-Number of Steps: 2 Home Layout: One level Home Equipment: [place] - 2 wheels;Cane - single point      Prior Function Level of Independence: Independent with assistive device(s)         Comments: Pt had advanced to straight cane after TKA in May.     Hand Dominance        Extremity/Trunk Assessment   Upper Extremity Assessment: Overall WFL for tasks assessed           Lower Extremity Assessment: Generalized weakness         Communication   Communication: No difficulties  Cognition Arousal/Alertness: Awake/alert Behavior During Therapy: WFL for tasks assessed/performed Overall Cognitive Status: Within Functional Limits for tasks assessed                      General Comments      Exercises        Assessment/Plan    PT Assessment Patient needs continued PT services  PT Diagnosis Difficulty walking;Generalized weakness   PT Problem List Decreased strength;Decreased mobility;Decreased balance;Obesity  PT Treatment Interventions DME instruction;Gait training;Functional mobility training;Therapeutic activities;Therapeutic exercise;Balance training;Patient/family education   PT Goals (Current goals can be found in the Care Plan section) Acute Rehab PT Goals Patient Stated Goal: Return home PT Goal Formulation: With patient Time For Goal Achievement:  04/29/14 Potential to Achieve Goals: Good    Frequency Min 3X/week   Barriers to discharge        Co-evaluation               End of Session   Activity Tolerance: Patient tolerated treatment well Patient left: in chair;with call bell/phone within reach           Time: 1047-1116 PT Time Calculation (min): 29 min   Charges:   PT Evaluation $Initial PT Evaluation Tier I: 1 Procedure PT  Treatments $Gait Training: 8-22 mins   PT G Codes:          Yi Falletta 05/09/14, 2:14 PM  [facility] PT [phone]

## 2014-04-22 NOTE — Progress Notes (Signed)
PULMONARY / CRITICAL CARE MEDICINE   Name: [name] [name] MRN: [id number] DOB: March 16, 1956    ADMISSION DATE:  04/19/2014  INITIAL PRESENTATION:  58 yo female former smoker with progressive dyspnea and intermittent chest pain from [facility] with acute on chronic hypercapnic respiratory failure requiring BiPAP.  PCCM asked to admit to ICU.  STUDIES:  Echo 8/18>>>  SIGNIFICANT EVENTS: 8/16 Transfer to [facility] 8/17 Cardiology consulted  SUBJECTIVE:  Wearing bipap at night but does not tol all night.  Remains on 2L [place].  Feeling better.   VITAL SIGNS: Temp:  [98.1 F (36.7 C)-98.8 F (37.1 C)] 98.8 F (37.1 C) (08/19 0507) Pulse Rate:  [87-97] 97 (08/19 0507) Resp:  [17-19] 17 (08/19 0507) BP: (104-135)/(56-73) 135/73 mmHg (08/19 0507) SpO2:  [88 %-98 %] 95 % (08/19 0731) Weight:  [349 lb 10.4 oz (158.6 kg)] 349 lb 10.4 oz (158.6 kg) (08/19 0507) 2L [place]   INTAKE / OUTPUT:  Intake/Output Summary (Last 24 hours) at 04/22/14 1108 Last data filed at 04/22/14 0800  Gross per 24 hour  Intake    480 ml  Output   3935 ml  Net  -3455 ml    PHYSICAL EXAMINATION: General: Very obese, pleasant, alert, appropriate, no distress, OOB in chair  Neuro:  Calm, appropriate, nonfocal HEENT: obese Cardiovascular: s1 s2 RR distant Lungs: resps even non labored on [place], Diminished BS Abdomen:  Obese, soft, NT, nd Ext: 3+ symmetric edema with chronic stasis changes - no changes   CBC  Recent Labs Lab 04/20/14 0218  WBC 7.1  HGB 11.8*  HCT 40.3  PLT 257   Coag's  Recent Labs Lab 04/22/14 0356  APTT 28   BMET  Recent Labs Lab 04/20/14 1949 04/21/14 0244 04/22/14 0356  NA 134* 135* 137  K 5.8* 3.8 4.0  CL 89* 90* 91*  CO2 32 34* 37*  BUN 53* 56* 46*  CREATININE 1.55* 1.61* 1.27*  GLUCOSE 115* 120* 119*   Electrolytes  Recent Labs Lab 04/20/14 0218 04/20/14 1949 04/21/14 0244 04/22/14 0356  CALCIUM 9.1 9.3 8.8 9.3  MG 1.8  --   --  1.8  PHOS 4.8*  --   --    --    Cardiac Enzymes  Recent Labs Lab 04/20/14 0218  TROPONINI <0.30  PROBNP 1338.0*   Glucose  Recent Labs Lab 04/20/14 1935 04/21/14 0807 04/21/14 1229 04/21/14 1634 04/21/14 2135 04/22/14 0609  GLUCAP 112* 80 88 123* 128* 110*    Imaging Dg Chest Port 1 View  04/21/2014   CLINICAL DATA:  Edema.  EXAM: PORTABLE CHEST - 1 VIEW  COMPARISON:  April 20, 2014.  FINDINGS: Stable cardiomegaly and central pulmonary vascular congestion is noted. No pneumothorax or significant pleural effusion is noted. Bony thorax is intact.  IMPRESSION: Stable cardiomegaly and central pulmonary vascular congestion.   Electronically Signed   By: Sabino Dick M.D.   On: 04/21/2014 07:36    Impression: 58 yo female with former smoker with progressive dyspnea and acute on chronic hypoxic/hypercapnic respiratory failure 2nd to acute on chronic diastolic dysfx, presumed COPD, and presumed sleep disordered breathing (OSA +/- OHS).  Acute on chronic respiratory failure. Plan: Oxygen to keep SpO2 > 90% BiPAP qhs Will need further sleep assessment as outpt Assess for home oxygen  Hx of smoking with presumed COPD and recurrent bronchitis. Plan: Change to symbicort with prn albuterol Will need outpt PFT's  Acute on chronic diastolic heart failure. Hx of HTN, hyperlipidemia. Plan: F/u  Echo from 8/19 >>  Continue norvasc, lasix, zocor  Chronic kidney disease >> baseline creatinine 1.3. Hyperkalemia >> resolved. Plan: Monitor renal fx, urine outpt, electrolytes  Morbid obesity. Plan: Nutrition consult  Hyperglycemia >> no hx of DM.  HbA1C 6.9 from 8/17. Plan: [employer] Diabetes coordinator for education Further assess as outpt  Insomnia. Plan: Continue PRN amitriptyline qHS PRN for sleep  Deconditioning. Plan: Pt consult  Hx of Osteoarthritis, Gout. P: Continue allopurinol Holding meloxicam in setting of renal dysfx  TODAY'S SUMMARY:  F/u echo results, cont PO lasix.  Ambulatory  desat.  Likely home in am   Nickolas Madrid, NP 04/22/2014  11:08 AM Pager: ([phone]) [phone] or [phone]  Updated husband at bedside about plan.  Likely d/c home in AM of 8/20.  Chesley Mires, MD [facility] Pulmonary/Critical Care 04/22/2014, 12:26 PM Pager:  [phone] After 3pm call: [phone]

## 2014-04-22 NOTE — Progress Notes (Signed)
Nutrition Consult/Brief Note  RD re-consulted given obesity, dietitian classes.  Patient was educated on weight loss and low sodium, low fat diet 8/17 during hospital stay.  Handouts provided.  Recommend outpatient referral to [facility] [phone].  Arthur Holms, RD, LDN Pager #: [phone] After-Hours Pager #: [phone]

## 2014-04-23 DIAGNOSIS — IMO0001 Reserved for inherently not codable concepts without codable children: Secondary | ICD-10-CM

## 2014-04-23 DIAGNOSIS — E1122 Type 2 diabetes mellitus with diabetic chronic kidney disease: Secondary | ICD-10-CM | POA: Insufficient documentation

## 2014-04-23 DIAGNOSIS — E1165 Type 2 diabetes mellitus with hyperglycemia: Secondary | ICD-10-CM

## 2014-04-23 DIAGNOSIS — N183 Chronic kidney disease, stage 3 (moderate): Secondary | ICD-10-CM

## 2014-04-23 DIAGNOSIS — E118 Type 2 diabetes mellitus with unspecified complications: Secondary | ICD-10-CM

## 2014-04-23 LAB — BASIC METABOLIC PANEL
ANION GAP: 9 (ref 5–15)
BUN: 29 mg/dL — ABNORMAL HIGH (ref 6–23)
CHLORIDE: 91 meq/L — AB (ref 96–112)
CO2: 39 meq/L — AB (ref 19–32)
Calcium: 9.7 mg/dL (ref 8.4–10.5)
Creatinine, Ser: 1.04 mg/dL (ref 0.50–1.10)
GFR calc non Af Amer: 58 mL/min — ABNORMAL LOW (ref >90)
GFR, EST AFRICAN AMERICAN: 68 mL/min — AB (ref >90)
Glucose, Bld: 122 mg/dL — ABNORMAL HIGH (ref 70–99)
POTASSIUM: 4 meq/L (ref 3.7–5.3)
SODIUM: 139 meq/L (ref 137–147)

## 2014-04-23 LAB — GLUCOSE, CAPILLARY
Glucose-Capillary: 122 mg/dL — ABNORMAL HIGH (ref 70–99)
Glucose-Capillary: 105 mg/dL — ABNORMAL HIGH (ref 70–99)

## 2014-04-23 MED ORDER — BLOOD GLUCOSE METER KIT: PACK | Status: DC

## 2014-04-23 MED ORDER — METFORMIN HCL 500 MG PO TABS: 500.0000 mg | ORAL_TABLET | Freq: Two times a day (BID) | ORAL | Status: DC

## 2014-04-23 MED ORDER — BUDESONIDE-FORMOTEROL FUMARATE 160-4.5 MCG/ACT IN AERO: 2.0000 | INHALATION_SPRAY | Freq: Two times a day (BID) | RESPIRATORY_TRACT | Status: AC

## 2014-04-23 NOTE — Progress Notes (Signed)
Pt refusing BIPAP at this time. Pt told to notify if she changes her mind. RT will continue to monitor.

## 2014-04-23 NOTE — Progress Notes (Signed)
Looks better today. Rate a go home. Lungs remain clear. Medications reviewed. Continue with current treatment plan. 1. Acute on chronic distolic CHF  - Echo Nov 1245 EF 80%, grade 2 diastolic dysfunction  - baseline weight 346 in June 2015, chronic LE edema 2/2 lymphedema post lymph node resection  - continue PO lasix 40mg  daily  2. Acute on chronic rep failure  - coreg discontinued due to bronchospasm  3. HTN  4. Obesity: need bariatric followup  5. Chronic lymphedema  6. CKD, no ACEI-renal function improved.  Candee Furbish, MD

## 2014-04-23 NOTE — Progress Notes (Signed)
Reviewed pt discharge education and prescriptions reviewed.  Tele discontinued.  Pt. Discharged with with 2 L [place], home oxygen.

## 2014-04-28 ENCOUNTER — Telehealth: Payer: Self-pay | Admitting: Cardiology

## 2014-04-28 NOTE — Telephone Encounter (Signed)
New message     Pt was released from hosp last week.  She has a question about whether or not to continue some of her medications

## 2014-04-28 NOTE — Telephone Encounter (Signed)
Went over discharge paper work from the hospital with pt and confirmed with her what medications she should stop taking and continue taking based on her discharge paper work from [facility] on 8/16.  Pt education provided.  Pt pleased with all the assistance provided.

## 2014-05-01 ENCOUNTER — Other Ambulatory Visit: Payer: Self-pay

## 2014-05-01 MED ORDER — AMLODIPINE BESYLATE 5 MG PO TABS: 5.0000 mg | ORAL_TABLET | Freq: Every day | ORAL | Status: DC

## 2014-05-08 ENCOUNTER — Encounter: Payer: Self-pay | Admitting: Cardiology

## 2014-05-08 ENCOUNTER — Ambulatory Visit (INDEPENDENT_AMBULATORY_CARE_PROVIDER_SITE_OTHER): Payer: Medicare Other | Admitting: Cardiology

## 2014-05-08 VITALS — BP 130/96 | HR 108 | Ht 67.0 in | Wt 337.8 lb

## 2014-05-08 DIAGNOSIS — I5033 Acute on chronic diastolic (congestive) heart failure: Secondary | ICD-10-CM

## 2014-05-08 LAB — BASIC METABOLIC PANEL
BUN: 18 mg/dL (ref 6–23)
CO2: 33 mEq/L — ABNORMAL HIGH (ref 19–32)
Calcium: 9.4 mg/dL (ref 8.4–10.5)
Chloride: 95 mEq/L — ABNORMAL LOW (ref 96–112)
Creatinine, Ser: 1.1 mg/dL (ref 0.4–1.2)
GFR: 53.18 mL/min — ABNORMAL LOW (ref >60.00)
Glucose, Bld: 131 mg/dL — ABNORMAL HIGH (ref 70–99)
Potassium: 4 mEq/L (ref 3.5–5.1)
Sodium: 136 mEq/L (ref 135–145)

## 2014-05-08 MED ORDER — HYDROCODONE-ACETAMINOPHEN 7.5-325 MG PO TABS: 1.0000 | ORAL_TABLET | Freq: Four times a day (QID) | ORAL | Status: DC | PRN

## 2014-05-08 NOTE — Progress Notes (Signed)
Patient ID: [name] [name], female   DOB: Nov 19, 1955, 58 y.o.   MRN: [id number]    Patient Name: [name] [name] Date of Encounter: 05/08/2014   58 year old patient with h/o HTN, squamous cell cancer of the vulva, s/p resection, currently in remission. The patient is coming complaining of dyspnea on exertion. The patient states that ever since she had her surgery and lymph node removal from both of her groins she developed significant lower extremity edema, became less active and gained a lot of weight. The patient states that she has no disease in the last year worsening dyspnea on exertion, or doing house chores, walking couple flight of stairs, she denies chest pain, palpitations or syncope. She states that her lower extremity edema is trying since her surgery doubling her furosemide from 40-80 mg daily improved gait in the last couple of weeks.  The patient was hospitalized on 04/19/14 with an acute respiratory failure due to COPD exacerbation and CHF exacerbation. She was discharged and is coming after 2 weeks. Her baseline weight is 346 lbs, today 336 lbs, -15 lbs since the discharge.  She started to follow low calorie, low sodium diet.  She denies resting SOB, chest pain. She uses home O2 most nights and sometimes during the day. She sleeps much better.   CURRENT MEDS  OBJECTIVE  Filed Vitals:   05/08/14 1019  BP: 130/96  Pulse: 108  Height: 5\' 7"  (1.702 m)  Weight: 337 lb 12.8 oz (153.225 kg)    Intake/Output Summary (Last 24 hours) at 04/21/14 1317 Last data filed at 04/21/14 1200  Gross per 24 hour  Intake    420 ml  Output   5476 ml  Net  -5056 ml   Filed Weights   05/08/14 1019  Weight: 337 lb 12.8 oz (153.225 kg)    PHYSICAL EXAM  General: Pleasant, NAD. Neuro: Alert and oriented X 3. Moves all extremities spontaneously. Psych: Normal affect. HEENT:  Normal  Neck: Supple without bruits or JVD. Lungs:  Resp regular and unlabored, CTA. Heart: RRR no s3, s4, or  murmurs. Abdomen: Soft, non-tender, non-distended, BS + x 4.  Extremities: No clubbing, cyanosis or edema. DP/PT/Radials 2+ and equal bilaterally.  Accessory Clinical Findings  CBC No results found for this basename: WBC, NEUTROABS, HGB, HCT, MCV, PLT,  in the last 72 hours Basic Metabolic Panel No results found for this basename: NA, K, CL, CO2, GLUCOSE, BUN, CREATININE, CALCIUM, MG, PHOS,  in the last 72 hours Liver Function Tests No results found for this basename: AST, ALT, ALKPHOS, BILITOT, PROT, ALBUMIN,  in the last 72 hours No results found for this basename: LIPASE, AMYLASE,  in the last 72 hours Cardiac Enzymes No results found for this basename: CKTOTAL, CKMB, CKMBINDEX, TROPONINI,  in the last 72 hours Hemoglobin A1C No results found for this basename: HGBA1C,  in the last 72 hours Radiology/Studies  Dg Chest Port 1 View  04/21/2014   CLINICAL DATA:  Edema.  EXAM: PORTABLE CHEST - 1 VIEW  COMPARISON:  April 20, 2014.  FINDINGS: Stable cardiomegaly and central pulmonary vascular congestion is noted. No pneumothorax or significant pleural effusion is noted. Bony thorax is intact.  IMPRESSION: Stable cardiomegaly and central pulmonary vascular congestion.   Electronically Signed   By: Sabino Dick M.D.   On: 04/21/2014 07:36   Dg Chest Port 1 View  04/20/2014   CLINICAL DATA:  Pulmonary edema  EXAM: PORTABLE CHEST - 1 VIEW  COMPARISON:  04/17/2014  FINDINGS: No change in moderate cardiomegaly. There is upper mediastinal widening which is likely from hypoaeration and portable technique.  Pulmonary venous congestion. No definite interstitial edema. No effusion or pneumothorax. No asymmetric opacity.  IMPRESSION: Cardiomegaly and pulmonary venous congestion.   Electronically Signed   By: Jorje Guild M.D.   On: 04/20/2014 07:02    TELE: SR, PVCs     ASSESSMENT AND PLAN  58 year old female with h/o vulvar squamous cell cancer that was admitted on 8/16 with worsening SOB and  was found to be in COPD exacerbation (acute on chronic respiratory failure with hypercapnia) as well as in CHF exacerbation.   1. Acute on Chronic diastolic congestive heart failure - resolved, currently euvolemic, only residual severe chronic lymphedema. Down 10 lbs from her baseline - new baseline 336 lbs.   - Continue PO Lasix 40 mg daily and check BMP today   2. Acute on chronic respiratory failure with hypercapnia - resolved, on home o2 - we will discontinue Carvedilol as she has component of bronchospam   3. Hypertension - borderline diastolic, d/c carvedilol (bronchospasm) Hold ACEI with CKD stage 3   4. Obesity - after adjusting her diet habbits, she has lost 10 lbs in 2 weeks, she is encouraged to continue with her new diet and start walking. She is limited post knee surgery with residual pain. We will prescribe limited Norco.   5. Chronic lymphedema - slightly improved.   Follow up on 07/01/2014  Signed, Dorothy Spark MD, [facility] 05/08/2014

## 2014-05-08 NOTE — Patient Instructions (Addendum)
Your physician recommends that you continue on your current medications as directed. Please refer to the Current Medication list given to you today.  Lab Today: Bmet  Keep your follow up appointment on 07/01/14

## 2014-05-08 NOTE — Addendum Note (Signed)
Addended by: Eulis Foster on: 05/08/2014 10:59 AM   Modules accepted: Orders

## 2014-05-14 ENCOUNTER — Other Ambulatory Visit: Payer: Self-pay | Admitting: Pulmonary Disease

## 2014-05-14 ENCOUNTER — Ambulatory Visit (INDEPENDENT_AMBULATORY_CARE_PROVIDER_SITE_OTHER): Payer: Medicare Other | Admitting: Pulmonary Disease

## 2014-05-14 DIAGNOSIS — R0602 Shortness of breath: Secondary | ICD-10-CM

## 2014-05-14 LAB — PULMONARY FUNCTION TEST
DL/VA % pred: 103 %
DL/VA: 4.96 ml/min/mmHg/L
DLCO UNC: 17.82 ml/min/mmHg
DLCO unc % pred: 73 %
FEF 25-75 POST: 1.41 L/s
FEF 25-75 Pre: 1.14 L/sec
FEF2575-%Change-Post: 23 %
FEF2575-%PRED-PRE: 46 %
FEF2575-%Pred-Post: 57 %
FEV1-%Change-Post: 5 %
FEV1-%PRED-PRE: 58 %
FEV1-%Pred-Post: 61 %
FEV1-POST: 1.61 L
FEV1-Pre: 1.52 L
FEV1FVC-%CHANGE-POST: 4 %
FEV1FVC-%Pred-Pre: 94 %
FEV6-%Change-Post: 5 %
FEV6-%PRED-PRE: 59 %
FEV6-%Pred-Post: 63 %
FEV6-Post: 2.05 L
FEV6-Pre: 1.94 L
FEV6FVC-%Change-Post: 4 %
FEV6FVC-%PRED-POST: 102 %
FEV6FVC-%Pred-Pre: 97 %
FVC-%CHANGE-POST: 0 %
FVC-%Pred-Post: 61 %
FVC-%Pred-Pre: 60 %
FVC-POST: 2.07 L
FVC-Pre: 2.05 L
POST FEV1/FVC RATIO: 78 %
POST FEV6/FVC RATIO: 99 %
PRE FEV1/FVC RATIO: 74 %
Pre FEV6/FVC Ratio: 95 %
RV % pred: 72 %
RV: 1.4 L
TLC % PRED: 83 %
TLC: 4.21 L

## 2014-05-14 NOTE — Progress Notes (Signed)
PFT done today. 

## 2014-05-18 ENCOUNTER — Telehealth: Payer: Self-pay | Admitting: Pulmonary Disease

## 2014-05-18 ENCOUNTER — Encounter: Payer: Self-pay | Admitting: Gynecologic Oncology

## 2014-05-18 ENCOUNTER — Encounter: Payer: Self-pay | Admitting: Pulmonary Disease

## 2014-05-18 ENCOUNTER — Ambulatory Visit: Payer: Medicare Other | Attending: Gynecologic Oncology | Admitting: Gynecologic Oncology

## 2014-05-18 VITALS — BP 162/85 | HR 97 | Temp 98.1°F | Resp 20 | Ht 67.0 in | Wt 336.2 lb

## 2014-05-18 DIAGNOSIS — Z87891 Personal history of nicotine dependence: Secondary | ICD-10-CM | POA: Diagnosis not present

## 2014-05-18 DIAGNOSIS — I509 Heart failure, unspecified: Secondary | ICD-10-CM | POA: Insufficient documentation

## 2014-05-18 DIAGNOSIS — E119 Type 2 diabetes mellitus without complications: Secondary | ICD-10-CM | POA: Diagnosis not present

## 2014-05-18 DIAGNOSIS — I10 Essential (primary) hypertension: Secondary | ICD-10-CM | POA: Insufficient documentation

## 2014-05-18 DIAGNOSIS — Z780 Asymptomatic menopausal state: Secondary | ICD-10-CM | POA: Diagnosis not present

## 2014-05-18 DIAGNOSIS — C519 Malignant neoplasm of vulva, unspecified: Secondary | ICD-10-CM | POA: Insufficient documentation

## 2014-05-18 NOTE — Telephone Encounter (Signed)
Spoke with the pt and notified that she needs to contact PCP for her DM testing supplies  She verbalized understanding  Nothing further needed

## 2014-05-18 NOTE — Patient Instructions (Signed)
We will contact you with the results of your biopsy results.

## 2014-05-18 NOTE — Addendum Note (Signed)
Addended by: Joylene John D on: 05/18/2014 03:52 PM   Modules accepted: Orders

## 2014-05-18 NOTE — Progress Notes (Signed)
Consult Note: Gyn-Onc   for the evaluation of [name] [name] 58 y.o. female  CC:  Chief Complaint  Patient presents with  . Vulvar Cancer    Assessment/Plan:  Ms. [name] [name]  is a 58 y.o.  year old woman who has a history of stage II vulvar cancer who has a new right vulvar lesion. It appears most consistent with a verrucous lesion (possibly low grade dysplasia).  We will follow up today's biopsy. If invasive cancer, she will require a radical re-excision. If dysplasia I will recommend wide local excision (simple partial vulvectomy) for symptom palliation and lesion removal.   HPI: [name] [name] is a 58 year old woman who is 14 years s/p radical vulvectomy and bilateral inguinal lymphadenectomy with Dr Cindie Laroche for stage II vulvar cancer. Nodes were negative. She did not require adjuvant therapy postop. She experienced long term bilateral lymphedema (exacerbated by her obesity and CHF) postoperatively, but otherwise no other issues. She was last seen by Dr Clarene Essex in 2006 at which time she was determined to still be NED for recurrence, and was discharged.  In the past 6 months she has noticed a slowly growing "lump" on the right vulva which is new. It is intermittently pruritic after micturition. She has inspected the lesion with a mirror and feels that it looks like what her prior cancer looked like.   Interval History: She continues to have mild pruritis. She was recently discharged from hospital for an exacerbation of CHF. She has a diagnosis of diabetes that is new (for which she takes Metformin).  Current Meds:  Outpatient Encounter Prescriptions as of 05/18/2014  Medication Sig  . albuterol (PROVENTIL) (2.5 MG/3ML) 0.083% nebulizer solution Take 2.5 mg by nebulization every 6 (six) hours as needed for wheezing or shortness of breath.  . allopurinol (ZYLOPRIM) 300 MG tablet Take 300 mg by mouth daily.  Marland Kitchen amitriptyline (ELAVIL) 10 MG tablet Take 10 mg by mouth at bedtime.  Marland Kitchen amLODipine  (NORVASC) 5 MG tablet Take 1 tablet (5 mg total) by mouth daily.  . Blood Glucose Monitoring Suppl (BLOOD GLUCOSE METER KIT AND SUPPLIES) Dispense based on patient and insurance preference. Use up to four times daily as directed. (FOR ICD-9 250.00, 250.01).  . budesonide-formoterol (SYMBICORT) 160-4.5 MCG/ACT inhaler Inhale 2 puffs into the lungs 2 (two) times daily.  . furosemide (LASIX) 40 MG tablet Take 1 tablet (40 mg total) by mouth daily.  Marland Kitchen HYDROcodone-acetaminophen (NORCO) 7.5-325 MG per tablet Take 1 tablet by mouth every 6 (six) hours as needed for moderate pain.  Marland Kitchen loratadine (CLARITIN) 10 MG tablet Take 10 mg by mouth daily.  . metFORMIN (GLUCOPHAGE) 500 MG tablet Take 1 tablet (500 mg total) by mouth 2 (two) times daily with a meal.  . pantoprazole (PROTONIX) 40 MG tablet Take 40 mg by mouth daily.  . pravastatin (PRAVACHOL) 20 MG tablet Take 20 mg by mouth daily.    Allergy:  Allergies  Allergen Reactions  . Penicillins Nausea And Vomiting and Rash  . Sulfa Antibiotics Hives and Rash    Exact drug unknown    Social Hx:   History   Social History  . Marital Status: Married    Spouse Name: N/A    Number of Children: 1  . Years of Education: N/A   Occupational History  . Not on file.   Social History Main Topics  . Smoking status: Former Smoker -- 1.50 packs/day for 25 years    Types: Cigarettes  .  Smokeless tobacco: Not on file  . Alcohol Use: Yes  . Drug Use: No  . Sexual Activity: Not on file   Other Topics Concern  . Not on file   Social History Narrative   Disabled due to obesity and osteoarthritis    Past Surgical Hx:  Past Surgical History  Procedure Laterality Date  . Radical vulvectomy    . Lymphadenectomy Bilateral     inguinal   . Cesarean section    . Colonoscopy w/ polypectomy      negative polyps  . Total knee arthroplasty Right 01/2014    [facility] (Dr Rito Ehrlich)    Past Medical Hx:  Past Medical History  Diagnosis Date   . Essential hypertension, benign   . Other and unspecified hyperlipidemia   . Polyp of nasal cavity   . Osteoarthrosis, unspecified whether generalized or localized, unspecified site   . Carpal tunnel syndrome     bilateral  . Esophageal reflux   . DOE (dyspnea on exertion)   . Lymphedema   . Vulva cancer     resected 2002  . Morbid obesity   . Diastolic heart failure     Echo 11/14 LVEF 55%. Ggrade 2 diastolic dysfunction  . Bronchitis, mucopurulent recurrent   . Former smoker   . Gout   . CHF (congestive heart failure)   . Diabetes mellitus without complication     Past Gynecological History: Vulvar cancer No LMP recorded. Patient is postmenopausal.  Family Hx:  Family History  Problem Relation Age of Onset  . Hypertension      family history  . Rheum arthritis      family history  . Stroke      family history    Review of Systems:  Constitutional  Feels well,    ENT Normal appearing ears and nares bilaterally Skin/Breast  No rash, sores, jaundice, itching, dryness Cardiovascular  No chest pain, shortness of breath, or edema  Pulmonary  No cough or wheeze.  Gastro Intestinal  No nausea, vomitting, or diarrhoea. No bright red blood per rectum, no abdominal pain, change in bowel movement, or constipation.  Genito Urinary  No frequency, urgency, dysuria,  Musculo Skeletal  No myalgia, arthralgia, joint swelling or pain  Neurologic  No weakness, numbness, change in gait,  Psychology  No depression, anxiety, insomnia.   Vitals:  Blood pressure 162/85, pulse 97, temperature 98.1 F (36.7 C), temperature source Oral, resp. rate 20, height _0  (1.702 m), weight 336 lb 3.2 oz (152.499 kg).  Physical Exam: WD in NAD Neck  Supple NROM, without any enlargements.  Lymph Node Survey No cervical supraclavicular or inguinal adenopathy Cardiovascular  Pulse normal rate, regularity and rhythm. S1 and S2 normal.  Lungs  Clear to auscultation bilateraly, without  wheezes/crackles/rhonchi. Good air movement.  Skin  No rash/lesions/breakdown  Psychiatry  Alert and oriented to person, place, and time  Abdomen  Normoactive bowel sounds, abdomen soft, non-tender and obese without evidence of hernia.  Back No CVA tenderness Genito Urinary  Vulva/vagina: A 2 cm verrucous, exophytic fleshy lesion (soft to palpate) on the right anterior labia major (>2cm from midline). Not adherent to underlying tissues.   Bladder/urethra:  No lesions or masses, well supported bladder  Vagina: normal  Cervix: Normal appearing, no lesions.  Uterus:  Small, mobile, no parametrial involvement or nodularity.  Adnexa: no palpable masses. Rectal  deferred Extremities  No bilateral cyanosis, clubbing or edema.  Procedure Note:  After verbal consent was  obtain and allergies were ascertained with patient (and patient confirmed she was not on anticoagulants), right vulva was prepped with betadine. The lesion was infiltrated with 1c of 1% plain lidocaine. A 2m punch biopsy was taken from central location in lesion. Hemostasis was achieved with a silver nitrate stick.  RDonaciano Eva MD   05/18/2014, 12:54 PM

## 2014-05-21 ENCOUNTER — Telehealth: Payer: Self-pay | Admitting: Gynecologic Oncology

## 2014-05-21 ENCOUNTER — Ambulatory Visit (INDEPENDENT_AMBULATORY_CARE_PROVIDER_SITE_OTHER): Payer: Medicare Other | Admitting: Pulmonary Disease

## 2014-05-21 ENCOUNTER — Encounter: Payer: Self-pay | Admitting: Pulmonary Disease

## 2014-05-21 VITALS — BP 140/80 | HR 109 | Ht 64.0 in | Wt 314.0 lb

## 2014-05-21 DIAGNOSIS — J449 Chronic obstructive pulmonary disease, unspecified: Secondary | ICD-10-CM

## 2014-05-21 DIAGNOSIS — J9620 Acute and chronic respiratory failure, unspecified whether with hypoxia or hypercapnia: Secondary | ICD-10-CM

## 2014-05-21 DIAGNOSIS — J9622 Acute and chronic respiratory failure with hypercapnia: Secondary | ICD-10-CM

## 2014-05-21 DIAGNOSIS — Z87891 Personal history of nicotine dependence: Secondary | ICD-10-CM

## 2014-05-21 DIAGNOSIS — E662 Morbid (severe) obesity with alveolar hypoventilation: Secondary | ICD-10-CM

## 2014-05-21 NOTE — Assessment & Plan Note (Signed)
She is to continue 2 liters oxygen at night.  Discussed importance of weight loss.  She had oxygen desaturation with exertion on room air today >> explained the importance of her using oxygen with activity during the day also.  Also discussed that she might still have sleep apnea in addition to OHS.  She does not feel like she has as much trouble with breathing while asleep, and was not able to tolerate PAP therapy.  She would like to defer further sleep testing for now.

## 2014-05-21 NOTE — Progress Notes (Signed)
Chief Complaint  Patient presents with  . Hospitalization Follow-up    Seen in [facility] 8/16-8/20 for CHF/DOE. Pt denies any SOB or cough.    History of Present Illness: [name] [name] is a 58 y.o. female former smoker with chronic hypoxic/hypercapnic respiratory failure, chronic bronchitis/asthma, OHS, and presumed OSA.  PFT from 05/14/14 showed mild diffusion defect.  She has been using symbicort and this helps.  Since she has been on symbicort she has not needed to use albuterol.    She is using 2 liters oxygen during the day.  She does not feel like she needs to use oxygen during the day.  She tries to stay active as best she can.  She was not able to tolerate CPAP/BiPAP at home.  She turned her machine back in, and cancelled her sleep study.  She feels her sleep and breathing at night is better with supplemental oxygen.  Her husband says she is not snoring anymore.  She plans to get flu and pneumonia shot with her PCP.  She is not having cough, wheeze, sputum, or chest pain.  Her leg swelling has gotten better.  She had oxygen desaturation on room air after 1 lap today >> improved with addition of 2 liters oxygen.  TESTS: Echo 04/22/14 >> EF 60 to 65% PFT 05/14/14 >> FEV1 1.61 (61%), FEV1% 78, TLC 4.21 (83%), DLCO 73%, no BD  Past medical hx, Past surgical hx, Medications, Allergies, Family hx, Social hx all reviewed.   Physical Exam:  General - No distress ENT - No sinus tenderness, no oral exudate, no LAN, wears glasses, MP 3, 2+ tonsills Cardiac - s1s2 regular, no murmur Chest - No wheeze/rales/dullness Back - No focal tenderness Abd - Soft, non-tender Ext - 1+ non pitting edema Neuro - Normal strength Skin - No rashes Psych - normal mood, and behavior   Assessment/Plan:  Chesley Mires, MD [place] Pager:  [phone]

## 2014-05-21 NOTE — Assessment & Plan Note (Signed)
Her PFT did not show obstruction/COPD.  She reports clinical response to symbicort.  Will continue this for now.  Discussed when she should consider using her albuterol.  She plans to get her flu and pneumonia shot with her PCP.

## 2014-05-21 NOTE — Patient Instructions (Signed)
Use oxygen during the day when doing activities, and continue to use oxygen with sleep Talk to you primary doctor about get flu and pneumonia vaccines Follow up in 6 months

## 2014-05-21 NOTE — Telephone Encounter (Signed)
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 I discussed that this can be managed with either expectant management (no intervention) or, if symptoms are bothersome, excision with a partial simple right vulvectomy. The patient will talk it over with her husband more and let us know. We will schedule her for a partial simple right vulvectomy (30 minutes), indication: vulvar dysplasia, at [place] (due to her obesity I do not believe she is a good candidate for the outpatient surgical center) if she notifies [place] that she is interested in having surgery. Donaciano Eva, MD

## 2014-05-28 ENCOUNTER — Telehealth: Payer: Self-pay | Admitting: Pulmonary Disease

## 2014-05-28 NOTE — Telephone Encounter (Signed)
I don't see where PA was done for pt.  Called Debbie. She reports they are awaiting CMN form pt to diabetic supplies. I advised her she needs to contact pt PCP for this. Nothing further needed

## 2014-06-03 ENCOUNTER — Telehealth: Payer: Self-pay | Admitting: *Deleted

## 2014-06-03 NOTE — Telephone Encounter (Signed)
[name] [name], Is there a way to place warning on this patient's chart so she never receives narcotics from [place] again?  Thank you, Ena Dawley

## 2014-06-03 NOTE — Telephone Encounter (Signed)
Pts current pharmacy [facility] [address] called to inform Dr Meda Coffee that pt has been flagged at multiple pharmacies and at multiple doctors offices for drug seeking prescription pain pills.  Pharmacist states that the pt has had multiple doctors write her for narcotic pain meds and has filled them at multiple pharmacies within a short period of time.  Pharmacist is calling all the pts doctors offices to make them aware of this, as the pt has been in trouble with this in the past.  Informed the pharmacist that I will further update Dr Meda Coffee of this occurrence.

## 2014-06-11 ENCOUNTER — Ambulatory Visit: Payer: Medicare Other | Admitting: *Deleted

## 2014-06-30 ENCOUNTER — Other Ambulatory Visit: Payer: Self-pay | Admitting: *Deleted

## 2014-06-30 ENCOUNTER — Encounter: Payer: Self-pay | Admitting: *Deleted

## 2014-06-30 DIAGNOSIS — I119 Hypertensive heart disease without heart failure: Secondary | ICD-10-CM | POA: Insufficient documentation

## 2014-07-01 ENCOUNTER — Ambulatory Visit (INDEPENDENT_AMBULATORY_CARE_PROVIDER_SITE_OTHER): Payer: Medicare Other | Admitting: Cardiology

## 2014-07-01 ENCOUNTER — Encounter: Payer: Self-pay | Admitting: Cardiology

## 2014-07-01 VITALS — BP 130/80 | HR 87 | Ht 67.0 in | Wt 326.2 lb

## 2014-07-01 DIAGNOSIS — J9610 Chronic respiratory failure, unspecified whether with hypoxia or hypercapnia: Secondary | ICD-10-CM | POA: Insufficient documentation

## 2014-07-01 DIAGNOSIS — F1920 Other psychoactive substance dependence, uncomplicated: Secondary | ICD-10-CM

## 2014-07-01 DIAGNOSIS — F1120 Opioid dependence, uncomplicated: Secondary | ICD-10-CM

## 2014-07-01 DIAGNOSIS — J9612 Chronic respiratory failure with hypercapnia: Secondary | ICD-10-CM

## 2014-07-01 DIAGNOSIS — I10 Essential (primary) hypertension: Secondary | ICD-10-CM

## 2014-07-01 DIAGNOSIS — I5032 Chronic diastolic (congestive) heart failure: Secondary | ICD-10-CM

## 2014-07-01 NOTE — Progress Notes (Signed)
Patient ID: [name] [name], female   DOB: [birth year], 58 y.o.   MRN: [id number]     Patient Name: [name] [name] Date of Encounter: 07/01/2014   58 year old patient with h/o HTN, squamous cell cancer of the vulva, s/p resection, currently in remission. The patient is coming complaining of dyspnea on exertion. The patient states that ever since she had her surgery and lymph node removal from both of her groins she developed significant lower extremity edema, became less active and gained a lot of weight. The patient states that she has no disease in the last year worsening dyspnea on exertion, or doing house chores, walking couple flight of stairs, she denies chest pain, palpitations or syncope. She states that her lower extremity edema is trying since her surgery doubling her furosemide from 40-80 mg daily improved gait in the last couple of weeks.  The patient was hospitalized on 04/19/14 with an acute respiratory failure due to COPD exacerbation and CHF exacerbation. She was discharged and is coming after 2 weeks. Her baseline weight is 346 lbs, today 336 lbs, -15 lbs since the discharge.  She started to follow low calorie, low sodium diet.  She denies resting SOB, chest pain. She uses home O2 most nights and sometimes during the day. She sleeps much better.  07/01/2014 - the patient is trying to stick to diabetic diet and started to walk a little bit more. She has lost another 10 pounds that would mean 28 pounds since she was discharged from the hospital. She found a new primary care physician close to her home that she check her labs 2 weeks ago and they were all within normal limits. She denies any chest pain or resting shortness of breath she overall feels much better. She continues have chronic lower extremity edema but it's improved compared to when she was hospitalized.   CURRENT MEDS  OBJECTIVE  Filed Vitals:   07/01/14 0911  BP: 130/80  Pulse: 87  Height: 5\' 7"  (1.702 m)  Weight: 326 lb  3.2 oz (147.963 kg)  SpO2: 91%    Filed Weights   07/01/14 0911  Weight: 326 lb 3.2 oz (147.963 kg)    PHYSICAL EXAM  General: Pleasant, NAD. Neuro: Alert and oriented X 3. Moves all extremities spontaneously. Psych: Normal affect. HEENT:  Normal  Neck: Supple without bruits or JVD. Lungs:  Resp regular and unlabored, CTA. Heart: RRR no s3, s4, or murmurs. Abdomen: Soft, non-tender, non-distended, BS + x 4.  Extremities: No clubbing, cyanosis or edema. DP/PT/Radials 2+ and equal bilaterally.  Accessory Clinical Findings  CBC No results found for this basename: WBC, NEUTROABS, HGB, HCT, MCV, PLT,  in the last 72 hours Basic Metabolic Panel No results found for this basename: NA, K, CL, CO2, GLUCOSE, BUN, CREATININE, CALCIUM, MG, PHOS,  in the last 72 hours Liver Function Tests No results found for this basename: AST, ALT, ALKPHOS, BILITOT, PROT, ALBUMIN,  in the last 72 hours No results found for this basename: LIPASE, AMYLASE,  in the last 72 hours Cardiac Enzymes No results found for this basename: CKTOTAL, CKMB, CKMBINDEX, TROPONINI,  in the last 72 hours Hemoglobin A1C No results found for this basename: HGBA1C,  in the last 72 hours Radiology/Studies  Dg Chest Port 1 View  04/21/2014   CLINICAL DATA:  Edema.  EXAM: PORTABLE CHEST - 1 VIEW  COMPARISON:  April 20, 2014.  FINDINGS: Stable cardiomegaly and central pulmonary vascular congestion is noted. No pneumothorax or significant  pleural effusion is noted. Bony thorax is intact.  IMPRESSION: Stable cardiomegaly and central pulmonary vascular congestion.   Electronically Signed   By: Sabino Dick M.D.   On: 04/21/2014 07:36   Dg Chest Port 1 View  04/20/2014   CLINICAL DATA:  Pulmonary edema  EXAM: PORTABLE CHEST - 1 VIEW  COMPARISON:  04/17/2014  FINDINGS: No change in moderate cardiomegaly. There is upper mediastinal widening which is likely from hypoaeration and portable technique.  Pulmonary venous congestion. No  definite interstitial edema. No effusion or pneumothorax. No asymmetric opacity.  IMPRESSION: Cardiomegaly and pulmonary venous congestion.   Electronically Signed   By: Jorje Guild M.D.   On: 04/20/2014 07:02      ASSESSMENT AND PLAN  58 year old female with h/o vulvar squamous cell cancer that was admitted on 8/16 with worsening SOB and was found to be in COPD exacerbation (acute on chronic respiratory failure with hypercapnia) as well as in CHF exacerbation.   1. Acute on Chronic diastolic congestive heart failure - resolved, currently euvolemic, only residual severe chronic lymphedema. Down 20 lbs from her baseline - new baseline 326 lbs. she would like to reach goal of under 300 pounds, we strongly encouraged it.  - Continue PO Lasix 40 mg daily, labs checked by her primary care physician.   2. Acute on chronic respiratory failure with hypercapnia - resolved, on home o2 - we will discontinue Carvedilol as she has component of bronchospam   3. Hypertension - controlled, carvedilol was discontinued (bronchospasm) Hold ACEI with CKD stage 3   4. Obesity - after adjusting her diet habbits, she has lost 20 lbs in 6 weeks, she is encouraged to continue with her new diet and start walking. She is limited post knee surgery with residual pain. We will prescribe limited Norco.   5. Chronic lymphedema - slightly improved.   6. Narcotic dealing - we were alerted from the pharmacy that within 2 weeks span she visited 7 different physician who prescribed narcotics, he also found out that in the past they were found to sell narcotics on a street and her husband was incarcerated for that earlier this year.  From now on visual never prescribed narcotics for this patient.  Follow up in 3 weeks.  Signed, Dorothy Spark MD, [facility] 07/01/2014

## 2014-07-01 NOTE — Patient Instructions (Signed)
Your physician recommends that you continue on your current medications as directed. Please refer to the Current Medication list given to you today.   Your physician recommends that you schedule a follow-up appointment in: [place]

## 2014-07-03 ENCOUNTER — Ambulatory Visit: Payer: Medicaid Other | Admitting: Cardiology

## 2014-08-20 ENCOUNTER — Telehealth: Payer: Self-pay | Admitting: Cardiology

## 2014-08-20 NOTE — Telephone Encounter (Signed)
Pt states her PCP increased her lasix to 40mg  bid.  Pt requesting refill with these directions.  I advised pt to contact PCP for refill since PCP prescribed lasix 40mg  bid not daily as indicated in Dr Francesca Oman last office note.

## 2014-08-20 NOTE — Telephone Encounter (Signed)
New message      Talk to a nurse regarding her fluid pill.  Her PCP said take 2 a day and Dr Meda Coffee said take 1 a day.  Now she cannot refill the medication unless Dr Meda Coffee call in a new presc.  [facility] [phone]

## 2014-09-30 ENCOUNTER — Ambulatory Visit (INDEPENDENT_AMBULATORY_CARE_PROVIDER_SITE_OTHER): Payer: Medicare HMO | Admitting: Cardiology

## 2014-09-30 ENCOUNTER — Telehealth: Payer: Self-pay | Admitting: *Deleted

## 2014-09-30 ENCOUNTER — Encounter: Payer: Self-pay | Admitting: Cardiology

## 2014-09-30 VITALS — BP 128/82 | HR 91 | Ht 66.0 in | Wt 313.6 lb

## 2014-09-30 DIAGNOSIS — I5032 Chronic diastolic (congestive) heart failure: Secondary | ICD-10-CM | POA: Diagnosis not present

## 2014-09-30 DIAGNOSIS — I10 Essential (primary) hypertension: Secondary | ICD-10-CM

## 2014-09-30 DIAGNOSIS — J9620 Acute and chronic respiratory failure, unspecified whether with hypoxia or hypercapnia: Secondary | ICD-10-CM

## 2014-09-30 DIAGNOSIS — I890 Lymphedema, not elsewhere classified: Secondary | ICD-10-CM

## 2014-09-30 LAB — BASIC METABOLIC PANEL
BUN: 29 mg/dL — ABNORMAL HIGH (ref 6–23)
CO2: 30 mEq/L (ref 19–32)
Calcium: 10 mg/dL (ref 8.4–10.5)
Chloride: 99 mEq/L (ref 96–112)
Creatinine, Ser: 1.28 mg/dL — ABNORMAL HIGH (ref 0.40–1.20)
GFR: 45.52 mL/min — ABNORMAL LOW (ref >60.00)
Glucose, Bld: 121 mg/dL — ABNORMAL HIGH (ref 70–99)
Potassium: 3.7 mEq/L (ref 3.5–5.1)
Sodium: 138 mEq/L (ref 135–145)

## 2014-09-30 NOTE — Progress Notes (Signed)
Patient ID: JHORDAN KINTER, female   DOB: [birth year], 59 y.o.   MRN: [id number]     Patient Name: [name] [name] Date of Encounter: 09/30/2014   59 year old patient with h/o HTN, squamous cell cancer of the vulva, s/p resection, currently in remission. The patient is coming complaining of dyspnea on exertion. The patient states that ever since she had her surgery and lymph node removal from both of her groins she developed significant lower extremity edema, became less active and gained a lot of weight. The patient states that she has no disease in the last year worsening dyspnea on exertion, or doing house chores, walking couple flight of stairs, she denies chest pain, palpitations or syncope. She states that her lower extremity edema is trying since her surgery doubling her furosemide from 40-80 mg daily improved gait in the last couple of weeks.  The patient was hospitalized on 04/19/14 with an acute respiratory failure due to COPD exacerbation and CHF exacerbation. She was discharged and is coming after 2 weeks. Her baseline weight is 346 lbs, today 336 lbs, -15 lbs since the discharge.  She started to follow low calorie, low sodium diet.  She denies resting SOB, chest pain. She uses home O2 most nights and sometimes during the day. She sleeps much better.  07/01/2014 - the patient is trying to stick to diabetic diet and started to walk a little bit more. She has lost another 10 pounds that would mean 28 pounds since she was discharged from the hospital. She found a new primary care physician close to her home that she check her labs 2 weeks ago and they were all within normal limits. She denies any chest pain or resting shortness of breath she overall feels much better. She continues have chronic lower extremity edema but it's improved compared to when she was hospitalized.  09/30/2014 - the patient is coming after 3 months, she feels better, uses home O2 at night, her SOB has improved and is able to  walk slightly more. Chest pain when emotionally stressed (her son is going through a separation and drinking problems). Continues to have LE lymphedema.   CURRENT MEDS  OBJECTIVE  Filed Vitals:   09/30/14 0957  BP: 128/82  Pulse: 91  Height: 5\' 6"  (1.676 m)  Weight: 313 lb 9.6 oz (142.248 kg)  SpO2: 96%    Filed Weights   09/30/14 0957  Weight: 313 lb 9.6 oz (142.248 kg)    PHYSICAL EXAM  General: Pleasant, NAD. Neuro: Alert and oriented X 3. Moves all extremities spontaneously. Psych: Normal affect. HEENT:  Normal  Neck: Supple without bruits or JVD. Lungs:  Resp regular and unlabored, CTA. Heart: RRR no s3, s4, or murmurs. Abdomen: Soft, non-tender, non-distended, BS + x 4.  Extremities: No clubbing, cyanosis or edema. DP/PT/Radials 2+ and equal bilaterally.  Accessory Clinical Findings  CBC No results for input(s): WBC, NEUTROABS, HGB, HCT, MCV, PLT in the last 72 hours. Basic Metabolic Panel No results for input(s): NA, K, CL, CO2, GLUCOSE, BUN, CREATININE, CALCIUM, MG, PHOS in the last 72 hours. Liver Function Tests No results for input(s): AST, ALT, ALKPHOS, BILITOT, PROT, ALBUMIN in the last 72 hours. No results for input(s): LIPASE, AMYLASE in the last 72 hours. Cardiac Enzymes No results for input(s): CKTOTAL, CKMB, CKMBINDEX, TROPONINI in the last 72 hours. Hemoglobin A1C No results for input(s): HGBA1C in the last 72 hours. Radiology/Studies  Dg Chest Port 1 View  04/21/2014   CLINICAL  DATA:  Edema.  EXAM: PORTABLE CHEST - 1 VIEW  COMPARISON:  April 20, 2014.  FINDINGS: Stable cardiomegaly and central pulmonary vascular congestion is noted. No pneumothorax or significant pleural effusion is noted. Bony thorax is intact.  IMPRESSION: Stable cardiomegaly and central pulmonary vascular congestion.   Electronically Signed   By: Sabino Dick M.D.   On: 04/21/2014 07:36   Dg Chest Port 1 View  04/20/2014   CLINICAL DATA:  Pulmonary edema  EXAM: PORTABLE CHEST  - 1 VIEW  COMPARISON:  04/17/2014  FINDINGS: No change in moderate cardiomegaly. There is upper mediastinal widening which is likely from hypoaeration and portable technique.  Pulmonary venous congestion. No definite interstitial edema. No effusion or pneumothorax. No asymmetric opacity.  IMPRESSION: Cardiomegaly and pulmonary venous congestion.   Electronically Signed   By: Jorje Guild M.D.   On: 04/20/2014 07:02     ASSESSMENT AND PLAN  59 year old female with h/o vulvar squamous cell cancer that was admitted on 8/16 with worsening SOB and was found to be in COPD exacerbation (acute on chronic respiratory failure with hypercapnia) as well as in CHF exacerbation.   1. Acute on Chronic diastolic congestive heart failure - resolved, currently euvolemic, only residual severe chronic lymphedema. Down 20 lbs from her baseline - new baseline 326 --- 313 lbs. she would like to reach goal of under 300 pounds, we strongly encouraged it.  - Continue PO Lasix 40 mg daily, labs checked by her primary care physician.   2. Acute on chronic respiratory failure with hypercapnia - resolved, on home o2 - we will discontinue Carvedilol as she has component of bronchospam   3. Hypertension - controlled, carvedilol was discontinued (bronchospasm) Hold ACEI with CKD stage 3   4. Obesity - after adjusting her diet habbits, she has lost 30 lbs in the last 4-5 months, she is encouraged to continue with her new diet and start walking. She is limited post knee surgery with residual pain. We will prescribe limited Norco.   5. Chronic lymphedema - slightly improved.   6. Narcotic dealing - we were alerted from the pharmacy that within 2 weeks span she visited 7 different physician who prescribed narcotics, he also found out that in the past they were found to sell narcotics on a street and her husband was incarcerated for that earlier this year.  From now on visual never prescribed narcotics for this  patient.  Follow up in 3 months.  Signed, Dorothy Spark MD, [facility] 09/30/2014

## 2014-09-30 NOTE — Telephone Encounter (Signed)
-----   Message from Dorothy Spark, MD sent at 09/30/2014  3:58 PM EST ----- Mildly elevated creatinine, I would cut back to lasix 40 mg po daily (instead of BID)

## 2014-09-30 NOTE — Patient Instructions (Signed)
Your physician recommends that you continue on your current medications as directed. Please refer to the Current Medication list given to you today.    Your physician recommends that you return for lab work in: [place])    Your physician recommends that you schedule a follow-up appointment in: [place]

## 2014-09-30 NOTE — Telephone Encounter (Signed)
Notified the pt of lab results per Dr Meda Coffee.  Informed the pt that per Dr Meda Coffee the pts lab showed mildly elevated creatinine, and the pt should cut back her lasix to 40 mg po daily.  Updated pts med in chart.  Pt verbalized understanding and agrees with this plan.

## 2014-12-16 DIAGNOSIS — E559 Vitamin D deficiency, unspecified: Secondary | ICD-10-CM | POA: Insufficient documentation

## 2014-12-30 ENCOUNTER — Ambulatory Visit: Payer: Medicare HMO | Admitting: Cardiology

## 2014-12-31 ENCOUNTER — Ambulatory Visit: Payer: Medicare Other | Admitting: Cardiology

## 2015-01-01 ENCOUNTER — Encounter: Payer: Self-pay | Admitting: Cardiology

## 2015-01-01 ENCOUNTER — Ambulatory Visit (INDEPENDENT_AMBULATORY_CARE_PROVIDER_SITE_OTHER): Payer: Medicare HMO | Admitting: Cardiology

## 2015-01-01 VITALS — BP 122/70 | HR 79 | Ht 66.0 in | Wt 306.4 lb

## 2015-01-01 DIAGNOSIS — I10 Essential (primary) hypertension: Secondary | ICD-10-CM

## 2015-01-01 DIAGNOSIS — I5032 Chronic diastolic (congestive) heart failure: Secondary | ICD-10-CM | POA: Diagnosis not present

## 2015-01-01 DIAGNOSIS — G4733 Obstructive sleep apnea (adult) (pediatric): Secondary | ICD-10-CM | POA: Diagnosis not present

## 2015-01-01 LAB — BASIC METABOLIC PANEL
BUN: 18 mg/dL (ref 6–23)
CO2: 33 mEq/L — ABNORMAL HIGH (ref 19–32)
Calcium: 10.7 mg/dL — ABNORMAL HIGH (ref 8.4–10.5)
Chloride: 98 mEq/L (ref 96–112)
Creatinine, Ser: 1.11 mg/dL (ref 0.40–1.20)
GFR: 53.61 mL/min — ABNORMAL LOW (ref >60.00)
Glucose, Bld: 112 mg/dL — ABNORMAL HIGH (ref 70–99)
Potassium: 4.3 mEq/L (ref 3.5–5.1)
Sodium: 138 mEq/L (ref 135–145)

## 2015-01-01 LAB — HEPATIC FUNCTION PANEL
ALT: 13 U/L (ref 0–35)
AST: 11 U/L (ref 0–37)
Albumin: 4.2 g/dL (ref 3.5–5.2)
Alkaline Phosphatase: 90 U/L (ref 39–117)
Bilirubin, Direct: 0.1 mg/dL (ref 0.0–0.3)
Total Bilirubin: 0.3 mg/dL (ref 0.2–1.2)
Total Protein: 7.3 g/dL (ref 6.0–8.3)

## 2015-01-01 NOTE — Patient Instructions (Signed)
Medication Instructions:  Your physician recommends that you continue on your current medications as directed. Please refer to the Current Medication list given to you today.  Labwork: Your physician recommends that you have lab work today: BMP and LIVER  Testing/Procedures: No new orders.  Follow-Up: Your physician wants you to follow-up in: 6 MONTHS with Dr Meda Coffee. You will receive a reminder letter in the mail two months in advance. If you don't receive a letter, please call our office to schedule the follow-up appointment.  Any Other Special Instructions Will Be Listed Below (If Applicable).

## 2015-01-01 NOTE — Progress Notes (Signed)
Patient ID: [name] [name], female   DOB: 07/04/1956, 59 y.o.   MRN: [id number]     Patient Name: [name] [name] Date of Encounter: 01/01/2015   59 year old patient with h/o HTN, squamous cell cancer of the vulva, s/p resection, currently in remission. The patient is coming complaining of dyspnea on exertion. The patient states that ever since she had her surgery and lymph node removal from both of her groins she developed significant lower extremity edema, became less active and gained a lot of weight. The patient states that she has no disease in the last year worsening dyspnea on exertion, or doing house chores, walking couple flight of stairs, she denies chest pain, palpitations or syncope. She states that her lower extremity edema is trying since her surgery doubling her furosemide from 40-80 mg daily improved gait in the last couple of weeks.  The patient was hospitalized on 04/19/14 with an acute respiratory failure due to COPD exacerbation and CHF exacerbation. She was discharged and is coming after 2 weeks. Her baseline weight is 346 lbs, today 336 lbs, -15 lbs since the discharge.  She started to follow low calorie, low sodium diet.  She denies resting SOB, chest pain. She uses home O2 most nights and sometimes during the day. She sleeps much better.  07/01/2014 - the patient is trying to stick to diabetic diet and started to walk a little bit more. She has lost another 10 pounds that would mean 28 pounds since she was discharged from the hospital. She found a new primary care physician close to her home that she check her labs 2 weeks ago and they were all within normal limits. She denies any chest pain or resting shortness of breath she overall feels much better. She continues have chronic lower extremity edema but it's improved compared to when she was hospitalized.  09/30/2014 - the patient is coming after 3 months, she feels better, uses home O2 at night, her SOB has improved and is able to  walk slightly more. Chest pain when emotionally stressed (her son is going through a separation and drinking problems). Continues to have LE lymphedema.   01/01/2015 - the patient is coming after 3 months, she continues to loose weight, she was seen by bariatric center and is being considered for a surgery. No CP, stable dyspnea on moderate exertion, stable LE lymphedema, no orthopnea, no PND.   CURRENT MEDS  OBJECTIVE  Filed Vitals:   01/01/15 0822  BP: 122/70  Pulse: 79  Height: 5\' 6"  (1.676 m)  Weight: 306 lb 6.4 oz (138.982 kg)  SpO2: 93%    Filed Weights   01/01/15 0822  Weight: 306 lb 6.4 oz (138.982 kg)    PHYSICAL EXAM  General: Pleasant, NAD. Neuro: Alert and oriented X 3. Moves all extremities spontaneously. Psych: Normal affect. HEENT:  Normal  Neck: Supple without bruits or JVD. Lungs:  Resp regular and unlabored, CTA. Heart: RRR no s3, s4, or murmurs. Abdomen: Soft, non-tender, non-distended, BS + x 4.  Extremities: No clubbing, cyanosis or edema. DP/PT/Radials 2+ and equal bilaterally.  Accessory Clinical Findings  Dg Chest Port 1 View  04/21/2014   CLINICAL DATA:  Edema.  EXAM: PORTABLE CHEST - 1 VIEW  COMPARISON:  April 20, 2014.  FINDINGS: Stable cardiomegaly and central pulmonary vascular congestion is noted. No pneumothorax or significant pleural effusion is noted. Bony thorax is intact.  IMPRESSION: Stable cardiomegaly and central pulmonary vascular congestion.   Electronically Signed  By: Sabino Dick M.D.   On: 04/21/2014 07:36   Dg Chest Port 1 View  04/20/2014   CLINICAL DATA:  Pulmonary edema  EXAM: PORTABLE CHEST - 1 VIEW  COMPARISON:  04/17/2014  FINDINGS: No change in moderate cardiomegaly. There is upper mediastinal widening which is likely from hypoaeration and portable technique.  Pulmonary venous congestion. No definite interstitial edema. No effusion or pneumothorax. No asymmetric opacity.  IMPRESSION: Cardiomegaly and pulmonary venous  congestion.   Electronically Signed   By: Jorje Guild M.D.   On: 04/20/2014 07:02     ASSESSMENT AND PLAN  59 year old female with h/o vulvar squamous cell cancer that was admitted on 8/16 with worsening SOB and was found to be in COPD exacerbation (acute on chronic respiratory failure with hypercapnia) as well as in CHF exacerbation.   1. Acute on Chronic diastolic congestive heart failure - resolved, currently euvolemic, only residual severe chronic lymphedema. Down 20 lbs from her baseline - new baseline 326 ---> 313-->306 lbs. she would like to reach goal of under 300 pounds, we strongly encouraged it.  - Continue PO Lasix 40 mg daily, check CMP today and adjust if needed.   2. Acute on chronic respiratory failure with hypercapnia - resolved, on home o2 - we will discontinue Carvedilol as she has component of bronchospam   3. Hypertension - controlled, carvedilol was discontinued (bronchospasm) Hold ACEI with CKD stage 3   4. Obesity - after adjusting her diet habbits, she has lost 30 lbs in the last 4-5 months, she is encouraged to continue with her new diet and start walking. Evaluation by a bariatric center.  5. Chronic lymphedema - slightly improved.   6. Narcotic dealing - we were alerted from the pharmacy that within 2 weeks span she visited 7 different physician who prescribed narcotics, he also found out that in the past they were found to sell narcotics on a street and her husband was incarcerated for that earlier this year.  From now on visual never prescribed narcotics for this patient.  Follow up in 6 months.  Signed, Dorothy Spark MD, [facility] 01/01/2015

## 2015-01-19 ENCOUNTER — Telehealth: Payer: Self-pay | Admitting: Cardiology

## 2015-01-19 NOTE — Telephone Encounter (Signed)
Informed the [name] [name] that per Dr Meda Coffee her labs showed improved kidney function, and she recommends the [name] [name] to continue the same medication regimen. [name] [name] verbalized understanding and agrees with this plan.

## 2015-01-19 NOTE — Telephone Encounter (Signed)
NEw Message   PT has not received results from Lab work done on 4/28. Please call back and discuss.

## 2015-02-03 ENCOUNTER — Ambulatory Visit: Payer: Medicare HMO | Admitting: Pulmonary Disease

## 2015-02-04 ENCOUNTER — Encounter: Payer: Self-pay | Admitting: Pulmonary Disease

## 2015-02-04 ENCOUNTER — Ambulatory Visit (INDEPENDENT_AMBULATORY_CARE_PROVIDER_SITE_OTHER): Payer: Medicare HMO | Admitting: Pulmonary Disease

## 2015-02-04 VITALS — BP 158/90 | HR 86 | Ht 64.0 in | Wt 299.2 lb

## 2015-02-04 DIAGNOSIS — J449 Chronic obstructive pulmonary disease, unspecified: Secondary | ICD-10-CM | POA: Diagnosis not present

## 2015-02-04 DIAGNOSIS — E662 Morbid (severe) obesity with alveolar hypoventilation: Secondary | ICD-10-CM | POA: Diagnosis not present

## 2015-02-04 NOTE — Patient Instructions (Signed)
Follow up in 6 months 

## 2015-02-04 NOTE — Progress Notes (Signed)
Chief Complaint  Patient presents with  . Follow-up    Pt denies any increased SOB since last OV - does not use O2 during the day, states that she puts it on about 6pm and wears all night. Using all inhalers as directed.     History of Present Illness: [name] [name] is a 59 y.o. female former smoker with chronic hypoxic/hypercapnic respiratory failure, chronic bronchitis/asthma, OHS, and presumed OSA.  She has been doing better.  She has lost about 30 lbs.  With weight loss she is not needing as much oxygen.  She uses 2 liters at night only now.  She is keeping up with her activities better.    She uses symbicort bid and this helps.  She is not getting much cough, sputum, or wheeze.  Her leg swelling is better.  She denies chest pain.   TESTS: Echo 04/22/14 >> EF 60 to 65% PFT 05/14/14 >> FEV1 1.61 (61%), FEV1% 78, TLC 4.21 (83%), DLCO 73%, no BD  Past medical hx >> Diastolic dysfx, HTN, HLD, DM, GERD, Vulvar cancer 2002, Lymphedema, Gout  Past surgical hx, Medications, Allergies, Family hx, Social hx all reviewed.   Physical Exam: Blood pressure 158/90, pulse 86, height 5\' 4"  (1.626 m), weight 299 lb 3.2 oz (135.716 kg), SpO2 95 %. Body mass index is 51.33 kg/(m^2).   Wt Readings from Last 5 Encounters:  02/04/15 299 lb 3.2 oz (135.716 kg)  01/01/15 306 lb 6.4 oz (138.982 kg)  09/30/14 313 lb 9.6 oz (142.248 kg)  07/01/14 326 lb 3.2 oz (147.963 kg)  05/21/14 314 lb (142.429 kg)    General - No distress ENT - No sinus tenderness, no oral exudate, no LAN, wears glasses, MP 3, 2+ tonsills Cardiac - s1s2 regular, no murmur Chest - No wheeze/rales/dullness Back - No focal tenderness Abd - Soft, non-tender Ext - 1+ non pitting edema Neuro - Normal strength Skin - No rashes Psych - normal mood, and behavior   Assessment/Plan:  Obesity hypoventilation syndrome with chronic hypoxic/hypercapnic respiratory failure. Improved with weight loss. Plan: - continue 2 liters oxygen  with sleep  Possible obstructive sleep apnea. Pt declined option for sleep study to further assess, and does not think she could tolerate PAP therapy. Plan: - monitor sleep pattern  Chronic asthmatic bronchitis. Plan: - continue symbicort and prn albuterol  Immunization. Plan: - she will need Prevnar sometime after November 2016 - explained she should get annual influenza vaccination  Morbid obesity. Plan: - encouraged her to keep up her weight loss efforts  Chronic diastolic dysfunction. Her BP was elevated today. Plan: - she is to f/u with PCP and cardiology  Narcotic dealing. Apparently she has been seeking narcotic prescriptions from several physicians. Plan: - defer any narcotic prescriptions   Chesley Mires, MD [facility] Pager:  [phone]

## 2015-02-25 ENCOUNTER — Telehealth: Payer: Self-pay | Admitting: Pulmonary Disease

## 2015-02-25 NOTE — Telephone Encounter (Signed)
Called and spoke to pt. Pt is requesting a POC for nocturnal use. Advised pt insurance will not cover a portable concentrator just for nocturnal use. Pt stated she is going out of town and will need her O2. Advised pt to call her DME and they can lend a rental POC for travel use. Pt verbalized understanding and denied any further questions or concerns at this time.

## 2015-07-05 ENCOUNTER — Ambulatory Visit (INDEPENDENT_AMBULATORY_CARE_PROVIDER_SITE_OTHER): Payer: Medicare HMO | Admitting: Cardiology

## 2015-07-05 ENCOUNTER — Encounter: Payer: Self-pay | Admitting: Cardiology

## 2015-07-05 VITALS — BP 126/72 | HR 86 | Ht 64.0 in | Wt 314.0 lb

## 2015-07-05 DIAGNOSIS — I10 Essential (primary) hypertension: Secondary | ICD-10-CM

## 2015-07-05 DIAGNOSIS — E785 Hyperlipidemia, unspecified: Secondary | ICD-10-CM | POA: Diagnosis not present

## 2015-07-05 DIAGNOSIS — I5031 Acute diastolic (congestive) heart failure: Secondary | ICD-10-CM | POA: Diagnosis not present

## 2015-07-05 LAB — BASIC METABOLIC PANEL
BUN: 30 mg/dL — ABNORMAL HIGH (ref 7–25)
CO2: 29 mmol/L (ref 20–31)
Calcium: 10 mg/dL (ref 8.6–10.4)
Chloride: 100 mmol/L (ref 98–110)
Creat: 1.32 mg/dL — ABNORMAL HIGH (ref 0.50–1.05)
Glucose, Bld: 116 mg/dL — ABNORMAL HIGH (ref 65–99)
Potassium: 4.1 mmol/L (ref 3.5–5.3)
Sodium: 136 mmol/L (ref 135–146)

## 2015-07-05 LAB — HEPATIC FUNCTION PANEL
ALT: 15 U/L (ref 6–29)
AST: 14 U/L (ref 10–35)
Albumin: 4.1 g/dL (ref 3.6–5.1)
Alkaline Phosphatase: 83 U/L (ref 33–130)
Bilirubin, Direct: 0.1 mg/dL (ref <=0.2)
Indirect Bilirubin: 0.3 mg/dL (ref 0.2–1.2)
Total Bilirubin: 0.4 mg/dL (ref 0.2–1.2)
Total Protein: 7.2 g/dL (ref 6.1–8.1)

## 2015-07-05 NOTE — Progress Notes (Signed)
Patient ID: VOLA BENEKE, female   DOB: 03/19/1956, 59 y.o.   MRN: [id number]     Patient Name: [name] [name] Date of Encounter: 07/05/2015   59 year old patient with h/o HTN, squamous cell cancer of the vulva, s/p resection, currently in remission. The patient is coming complaining of dyspnea on exertion. The patient states that ever since she had her surgery and lymph node removal from both of her groins she developed significant lower extremity edema, became less active and gained a lot of weight. The patient states that she has no disease in the last year worsening dyspnea on exertion, or doing house chores, walking couple flight of stairs, she denies chest pain, palpitations or syncope. She states that her lower extremity edema is trying since her surgery doubling her furosemide from 40-80 mg daily improved gait in the last couple of weeks.  The patient was hospitalized on 04/19/14 with an acute respiratory failure due to COPD exacerbation and CHF exacerbation. She was discharged and is coming after 2 weeks. Her baseline weight is 346 lbs, today 336 lbs, -15 lbs since the discharge.  She started to follow low calorie, low sodium diet.  She denies resting SOB, chest pain. She uses home O2 most nights and sometimes during the day. She sleeps much better.  07/01/2014 - the patient is trying to stick to diabetic diet and started to walk a little bit more. She has lost another 10 pounds that would mean 28 pounds since she was discharged from the hospital. She found a new primary care physician close to her home that she check her labs 2 weeks ago and they were all within normal limits. She denies any chest pain or resting shortness of breath she overall feels much better. She continues have chronic lower extremity edema but it's improved compared to when she was hospitalized.  09/30/2014 - the patient is coming after 58 months, she feels better, uses home O2 at night, her SOB has improved and is able to  walk slightly more. Chest pain when emotionally stressed (her son is going through a separation and drinking problems). Continues to have LE lymphedema.   07/05/2015 - the patient is coming after 58 months, she gained 8 lbs, and has more LE edema. She was seen by bariatric center and is being considered for a surgery. No CP, stable dyspnea on moderate exertion, stable LE lymphedema, no orthopnea, no PND. She doesn't use home O2, states that she doesn't see a difference.   CURRENT MEDS  OBJECTIVE  Filed Vitals:   07/05/15 0926  BP: 126/72  Pulse: 86  Height: 5\' 4"  (1.626 m)  Weight: 314 lb (142.429 kg)  SpO2: 96%    Filed Weights   07/05/15 0926  Weight: 314 lb (142.429 kg)    PHYSICAL EXAM  General: Pleasant, NAD. Neuro: Alert and oriented X 3. Moves all extremities spontaneously. Psych: Normal affect. HEENT:  Normal  Neck: Supple without bruits or JVD. Lungs:  Resp regular and unlabored, CTA. Heart: RRR no s3, s4, or murmurs. Abdomen: Soft, non-tender, non-distended, BS + x 4.  Extremities: No clubbing, cyanosis or edema. DP/PT/Radials 2+ and equal bilaterally.  Accessory Clinical Findings  Dg Chest Port 1 View  04/21/2014   CLINICAL DATA:  Edema.  EXAM: PORTABLE CHEST - 1 VIEW  COMPARISON:  April 20, 2014.  FINDINGS: Stable cardiomegaly and central pulmonary vascular congestion is noted. No pneumothorax or significant pleural effusion is noted. Bony thorax is intact.  IMPRESSION:  Stable cardiomegaly and central pulmonary vascular congestion.   Electronically Signed   By: Sabino Dick M.D.   On: 04/21/2014 07:36   Dg Chest Port 1 View  04/20/2014   CLINICAL DATA:  Pulmonary edema  EXAM: PORTABLE CHEST - 1 VIEW  COMPARISON:  04/17/2014  FINDINGS: No change in moderate cardiomegaly. There is upper mediastinal widening which is likely from hypoaeration and portable technique.  Pulmonary venous congestion. No definite interstitial edema. No effusion or pneumothorax. No  asymmetric opacity.  IMPRESSION: Cardiomegaly and pulmonary venous congestion.   Electronically Signed   By: Jorje Guild M.D.   On: 04/20/2014 07:02     ASSESSMENT AND PLAN  59 year old female with h/o vulvar squamous cell cancer that was admitted on 8/16 with worsening SOB and was found to be in COPD exacerbation (acute on chronic respiratory failure with hypercapnia) as well as in CHF exacerbation.   1. Acute on Chronic diastolic congestive heart failure - we will increase lasix to 40 mg po BID then back to 40 mg po daily. Check CMP today.  2. Acute on chronic respiratory failure with hypercapnia - resolved, on home o2,  - we discontinued Carvedilol as she has component of bronchospam   3. Hypertension - controlled, carvedilol was discontinued (bronchospasm) Hold ACEI with CKD stage 3   4. Obesity - after adjusting her diet habbits, she has lost 30 lbs in the last 4-5 months, now gaining weight, advised about diet.  5. Chronic lymphedema - slightly improved.   6. Narcotic dealing - we were alerted from the pharmacy that within 2 weeks span she visited 7 different physician who prescribed narcotics, he also found out that in the past they were found to sell narcotics on a street and her husband was incarcerated for that earlier this year.  From now on visual never prescribed narcotics for this patient.  Follow up in 6 months.  Signed, Dorothy Spark MD, [facility] 07/05/2015

## 2015-07-05 NOTE — Patient Instructions (Signed)
Medication Instructions:   INCREASE LASIX TO 40 MG TWICE A DAY  FOR TWO WEEKS THEN RESUME BACK TO NORMAL DOSE   If you need a refill on your cardiac medications before your next appointment, please call your pharmacy.  Labwork: BMET AND LFT    Testing/Procedures: NONE ORDER TODAY    Follow-Up: Your physician wants you to follow-up in:  IN  6  MONTHS WITH DR Johann Capers will receive a reminder letter in the mail two months in advance. If you don't receive a letter, please call our office to schedule the follow-up appointment.       Any Other Special Instructions Will Be Listed Below (If Applicable).

## 2015-07-06 ENCOUNTER — Telehealth: Payer: Self-pay | Admitting: *Deleted

## 2015-07-06 MED ORDER — FUROSEMIDE 40 MG PO TABS: 40.0000 mg | ORAL_TABLET | Freq: Every day | ORAL | Status: DC

## 2015-07-06 NOTE — Telephone Encounter (Signed)
-----   Message from Dorothy Spark, MD sent at 07/06/2015 10:24 AM EDT ----- midly increased Crea, she should take lasix 40 mg po BID x 1 week then go back to 40 mg po daily

## 2015-07-06 NOTE — Telephone Encounter (Signed)
Informed the pt that per Dr Meda Coffee, based on her lab results from 07/05/15, her creatinine is mildly increased, and she recommends that the pt take lasix 40 mg po BID x1 week, then go back to 40 mg po daily.  Confirmed the pharmacy of choice with the pt.  Informed the pt that I will notate this acute dose change in the pharmacy notes of her lasix prescription.  Pt verbalized understanding and agrees with this plan.

## 2015-07-07 ENCOUNTER — Telehealth: Payer: Self-pay | Admitting: Cardiology

## 2015-07-07 NOTE — Telephone Encounter (Signed)
Contacted the pt back to inform her that Dr Meda Coffee has written a prescription for her to use compression stockings daily, and we can either mail this to her current mailing address, or she can come by the office and pick this up at the front desk.  Pt request that I mail the compression stocking prescription to her current mailing address.  Confirmed the pts mailing address with her.  Informed the pt that I will mail this to her today.  Pt verbalized understanding, agrees with this plan, and gracious for all the assistance provided.

## 2015-07-07 NOTE — Telephone Encounter (Signed)
New message     Pt was seen last Monday.  Talk to the nurse about getting compression stockings.  Pt states she "holds" fluid.

## 2015-07-15 IMAGING — CR DG CHEST 1V PORT
1 series · 1 of 1 positions shown · non-contrast
Comparison: 04/17/2014

CLINICAL DATA: Pulmonary edema

EXAM:
PORTABLE CHEST - 1 VIEW

[AP]
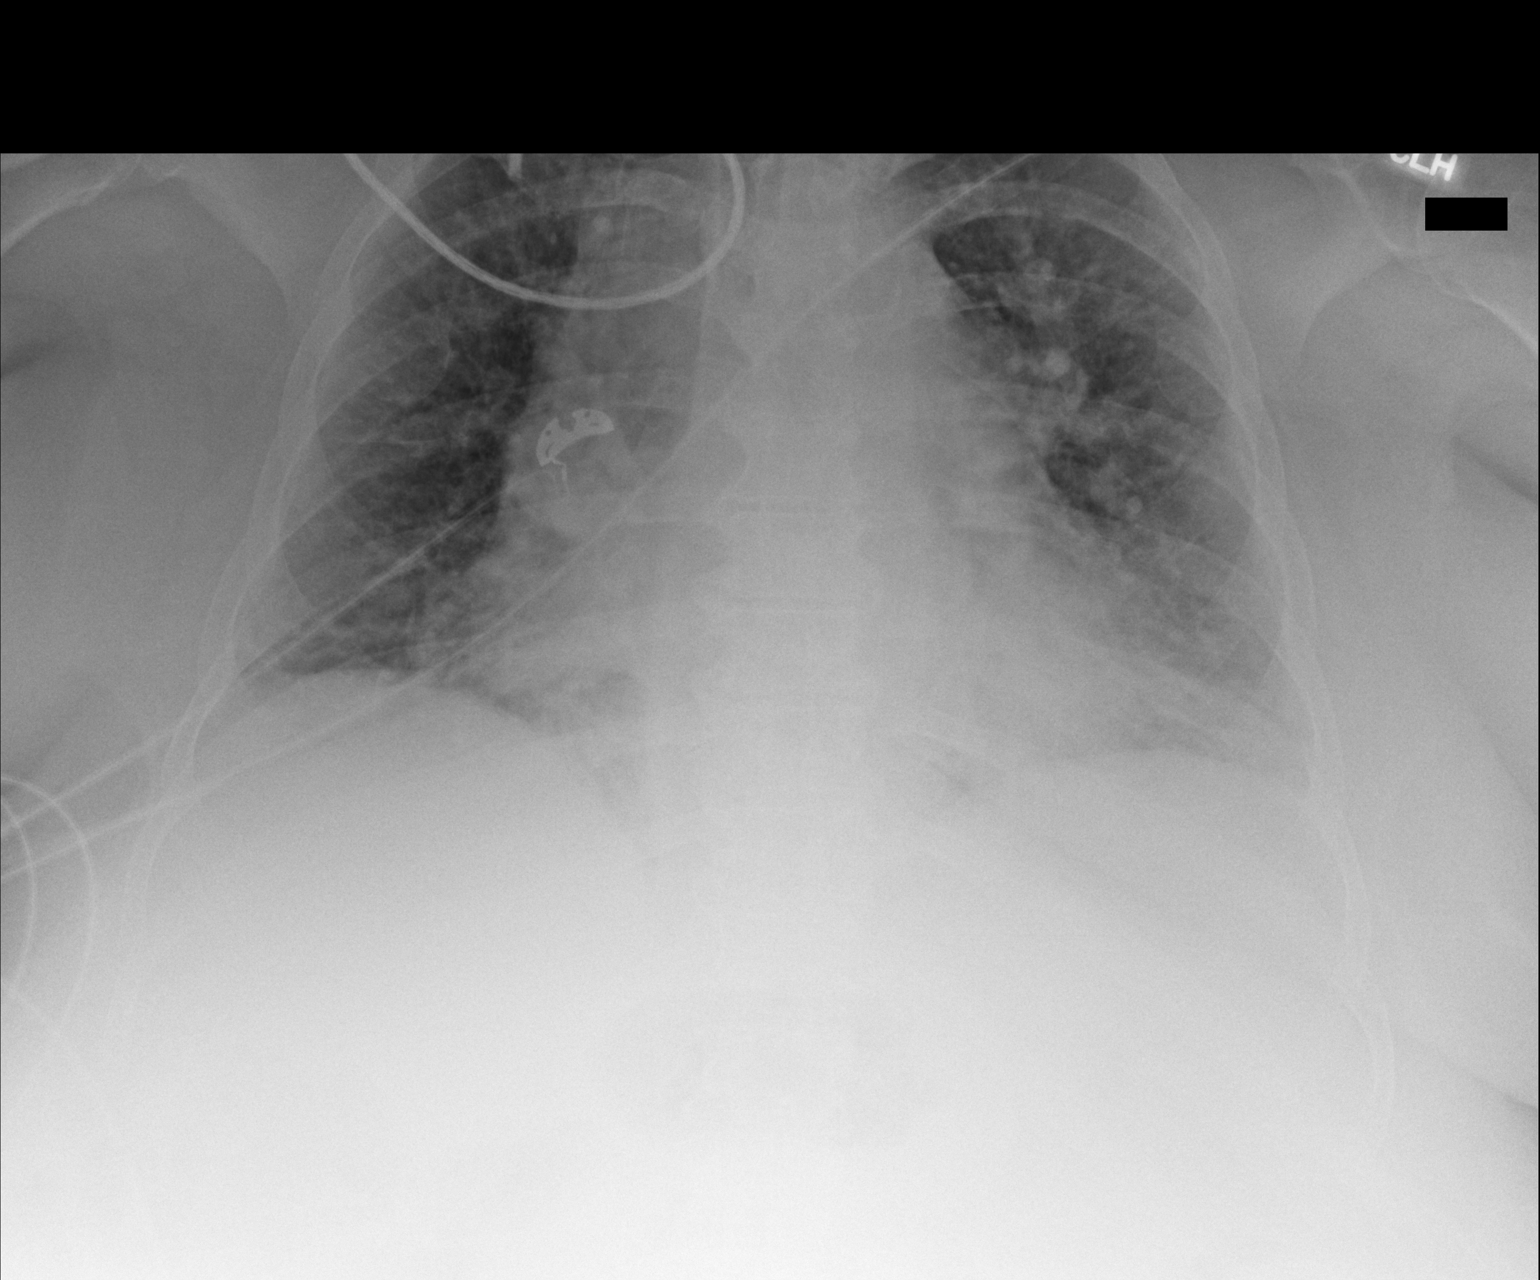

[1 of 1 positions shown; findings below may reference images not displayed]

FINDINGS: No change in moderate cardiomegaly. There is upper mediastinal
widening which is likely from hypoaeration and portable technique.

Pulmonary venous congestion. No definite interstitial edema. No
effusion or pneumothorax. No asymmetric opacity.
IMPRESSION: Cardiomegaly and pulmonary venous congestion.

## 2015-07-16 IMAGING — CR DG CHEST 1V PORT
1 series · 1 of 1 positions shown · non-contrast
Comparison: April 20, 2014.

CLINICAL DATA: Edema.

EXAM:
PORTABLE CHEST - 1 VIEW

[AP]
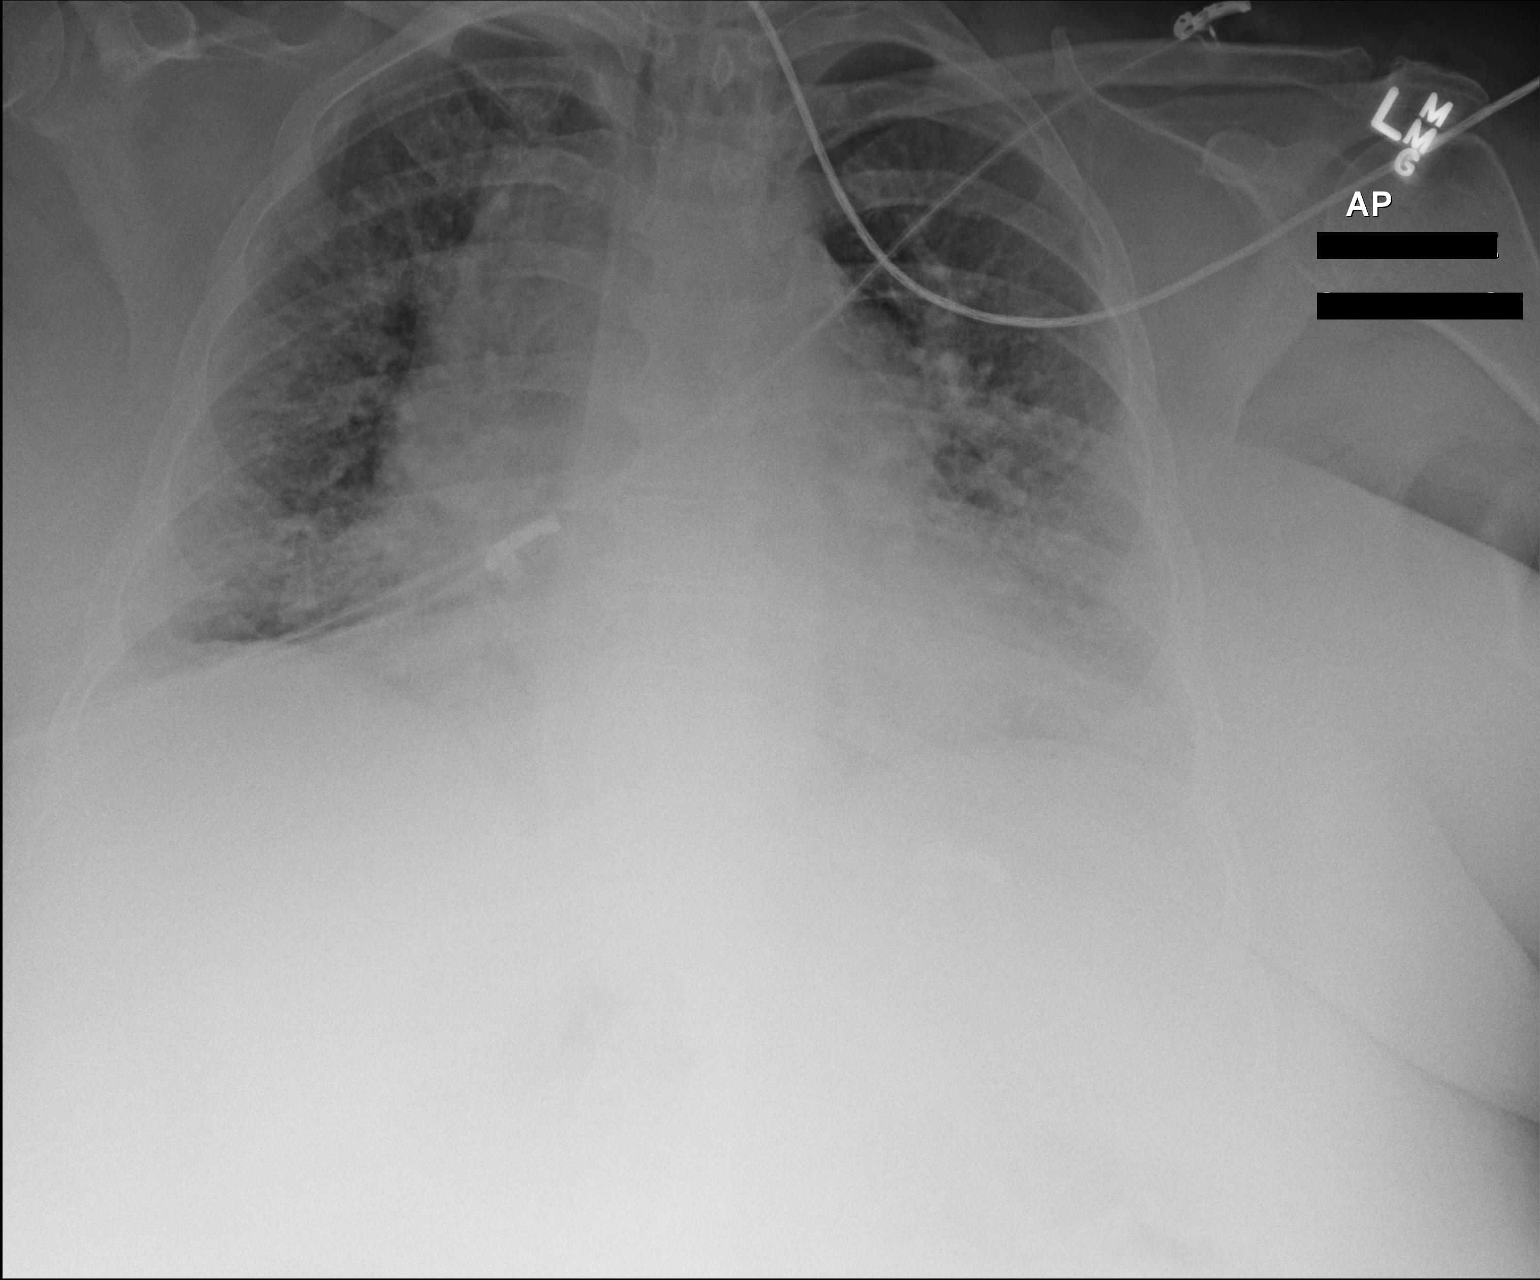

[1 of 1 positions shown; findings below may reference images not displayed]

FINDINGS: Stable cardiomegaly and central pulmonary vascular congestion is
noted. No pneumothorax or significant pleural effusion is noted.
Bony thorax is intact.
IMPRESSION: Stable cardiomegaly and central pulmonary vascular congestion.

## 2015-08-10 ENCOUNTER — Telehealth: Payer: Self-pay | Admitting: Cardiology

## 2015-08-10 DIAGNOSIS — I5032 Chronic diastolic (congestive) heart failure: Secondary | ICD-10-CM

## 2015-08-10 NOTE — Telephone Encounter (Signed)
Pt is calling in as instructed by her PCP, to inform Dr Meda Coffee that she is retaining more fluid in her lower extremities, and her PCP did not feel comfortable adjusting her lasix, and advised that Dr Meda Coffee should.  Pt states she saw her PCP today, for she fell over the weekend.  Pt states that her PCP did do labs on her to check her renal function.  Advised the pt that she should have her PCP fax her results to our office at [phone], once they call her with them.  Advised the pt to start weighing herself at the same time upon waking every morning, and log this information.  Informed the pt that this is helpful baseline information, that can indicate how much fluid she is actually retaining.  Informed the pt that Dr Meda Coffee is currently out of the office the rest of the day, but I will route this message to her for further review and recommendation and follow-up with the pt thereafter.  Pt verbalized understanding and agrees with this plan.

## 2015-08-10 NOTE — Telephone Encounter (Signed)
New Message  Pt c/o swelling: STAT is pt has developed SOB within 24 hours  1. How long have you been experiencing swelling? 1 week   2. Where is the swelling located? Feet, legs ankles and knee's (Pt fell on Saturday)n   3.  Are you currently taking a "fluid pill"?Yes   4.  Are you currently SOB? Yes but that's normal   Comements: Pt request a call back to determine how she should adjust her lasix

## 2015-08-11 MED ORDER — FUROSEMIDE 40 MG PO TABS: 40.0000 mg | ORAL_TABLET | Freq: Two times a day (BID) | ORAL | Status: DC

## 2015-08-11 NOTE — Telephone Encounter (Signed)
She can increase lasix to 40 mg po BID but will need BMP in 10 days.

## 2015-08-11 NOTE — Telephone Encounter (Signed)
Notified the pt that per Dr Meda Coffee she recommends that she increase her lasix to 40 mg po BID and come in for a BMET in 10 days.  Pt states that she has enough supply of 40 mg of lasix on hand right now, to hold off on sending this to her pharmacy.  Pt states she will contact the office back to send in the new refill of lasix 40 mg po bid when supply exhaust.  Scheduled the pt to come into the office for a repeat BMET on 08/23/15.  Pt verbalized understanding, agrees with this plan, and gracious for all the assistance provided.

## 2015-08-16 ENCOUNTER — Telehealth: Payer: Self-pay | Admitting: Cardiology

## 2015-08-16 NOTE — Telephone Encounter (Signed)
New message       Talk to the nurse regarding her lab appt scheduled for next monday

## 2015-08-16 NOTE — Telephone Encounter (Signed)
F/u    Pt need call back from nurse to discuss previous message.

## 2015-08-16 NOTE — Telephone Encounter (Signed)
Patient request she have her labs drawn at [facility] , PCP MD Order for BMET order per Dr. Meda Coffee faxed to [place], [phone]

## 2015-08-16 NOTE — Telephone Encounter (Signed)
Returned call unsuccessful do to system outage

## 2015-08-17 NOTE — Telephone Encounter (Signed)
Spoke with the pt to inform her that Mechele Claude from our office did fax a bmet order to her PCP office yesterday.  Per the pt she now has changed her mind, and prefers having this lab drawn at our office instead, for she states she will "get the results quicker." Pt states she is feeling much better with the increase in lasix, and she can breath better.  Pt states she will report to our office on 12/19 for lab (bmet) as ordered by Dr Meda Coffee last week.

## 2015-08-23 ENCOUNTER — Encounter: Payer: Self-pay | Admitting: *Deleted

## 2015-08-23 ENCOUNTER — Other Ambulatory Visit (INDEPENDENT_AMBULATORY_CARE_PROVIDER_SITE_OTHER): Payer: Medicare HMO | Admitting: *Deleted

## 2015-08-23 DIAGNOSIS — I5032 Chronic diastolic (congestive) heart failure: Secondary | ICD-10-CM | POA: Diagnosis not present

## 2015-08-23 LAB — BASIC METABOLIC PANEL
BUN: 29 mg/dL — ABNORMAL HIGH (ref 7–25)
CO2: 33 mmol/L — ABNORMAL HIGH (ref 20–31)
Calcium: 9.2 mg/dL (ref 8.6–10.4)
Chloride: 98 mmol/L (ref 98–110)
Creat: 1.24 mg/dL — ABNORMAL HIGH (ref 0.50–1.05)
Glucose, Bld: 121 mg/dL — ABNORMAL HIGH (ref 65–99)
Potassium: 4.3 mmol/L (ref 3.5–5.3)
Sodium: 136 mmol/L (ref 135–146)

## 2015-09-21 ENCOUNTER — Telehealth: Payer: Self-pay | Admitting: Cardiology

## 2015-09-21 ENCOUNTER — Other Ambulatory Visit: Payer: Self-pay | Admitting: *Deleted

## 2015-09-21 DIAGNOSIS — I5032 Chronic diastolic (congestive) heart failure: Secondary | ICD-10-CM

## 2015-09-21 MED ORDER — FUROSEMIDE 40 MG PO TABS: 40.0000 mg | ORAL_TABLET | Freq: Two times a day (BID) | ORAL | Status: DC

## 2015-09-21 NOTE — Telephone Encounter (Signed)
New message       *STAT* If patient is at the pharmacy, call can be transferred to refill team.   1. Which medications need to be refilled? (please list name of each medication and dose if known) furosemide 40mg --2 tab daily  2. Which pharmacy/location (including street and city if local pharmacy) is medication to be sent to? [facility] in [place]  3. Do they need a 30 day or 90 day supply? 90day supply Need new presc with new dosage sent in to pharmacy

## 2015-10-27 ENCOUNTER — Telehealth: Payer: Self-pay | Admitting: Pulmonary Disease

## 2015-10-27 ENCOUNTER — Encounter: Payer: Self-pay | Admitting: Pulmonary Disease

## 2015-10-27 ENCOUNTER — Ambulatory Visit (INDEPENDENT_AMBULATORY_CARE_PROVIDER_SITE_OTHER): Payer: Medicare HMO | Admitting: Pulmonary Disease

## 2015-10-27 VITALS — BP 182/88 | HR 83 | Ht 66.0 in | Wt 315.8 lb

## 2015-10-27 DIAGNOSIS — E662 Morbid (severe) obesity with alveolar hypoventilation: Secondary | ICD-10-CM

## 2015-10-27 DIAGNOSIS — Z23 Encounter for immunization: Secondary | ICD-10-CM

## 2015-10-27 NOTE — Telephone Encounter (Signed)
Called spoke with pt. She wanted to ensure she received the PNA vaccine. I advised Dr. Halford Chessman documented she was to receive the prevnar 63. She needed nothing further

## 2015-10-27 NOTE — Progress Notes (Signed)
Current Outpatient Prescriptions on File Prior to Visit  Medication Sig  . albuterol (PROVENTIL HFA;VENTOLIN HFA) 108 (90 BASE) MCG/ACT inhaler INHALE 2 PUFFS INTO THE LUNGS EVERY 4 HOURS AS NEEDED FOR SHORTNESS OF BREATH  . allopurinol (ZYLOPRIM) 300 MG tablet Take 300 mg by mouth daily.  Marland Kitchen ALPRAZolam (XANAX) 0.5 MG tablet Take 1 tablet by mouth 3 (three) times daily as needed.  Marland Kitchen amitriptyline (ELAVIL) 10 MG tablet Take 10 mg by mouth at bedtime.  Marland Kitchen amLODipine (NORVASC) 5 MG tablet Take 1 tablet (5 mg total) by mouth daily.  Marland Kitchen aspirin 81 MG chewable tablet Chew 81 mg by mouth daily.  . Blood Glucose Monitoring Suppl (BLOOD GLUCOSE METER KIT AND SUPPLIES) Dispense based on patient and insurance preference. Use up to four times daily as directed. (FOR ICD-9 250.00, 250.01).  . budesonide-formoterol (SYMBICORT) 160-4.5 MCG/ACT inhaler Inhale 2 puffs into the lungs 2 (two) times daily.  . furosemide (LASIX) 40 MG tablet Take 1 tablet (40 mg total) by mouth 2 (two) times daily.  Marland Kitchen gabapentin (NEURONTIN) 100 MG capsule Take 100 mg by mouth 3 (three) times daily as needed.  . metFORMIN (GLUCOPHAGE) 500 MG tablet Take 1 tablet (500 mg total) by mouth 2 (two) times daily with a meal.  . oxybutynin (DITROPAN-XL) 10 MG 24 hr tablet Take 1 tablet by mouth daily.  . pantoprazole (PROTONIX) 40 MG tablet Take 40 mg by mouth daily.  . pravastatin (PRAVACHOL) 20 MG tablet Take 20 mg by mouth daily.   No current facility-administered medications on file prior to visit.     Chief Complaint  Patient presents with  . Follow-up    No complaints. Discuss O2 system at home     Tests Echo 04/22/14 >> EF 60 to 65% PFT 05/14/14 >> FEV1 1.61 (61%), FEV1% 78, TLC 4.21 (83%), DLCO 73%, no BD  Past medical hx HTN, diastolic CHF, HLD, OA, GERD, Vulvar cancer, lymphedema, Gout, DM  Past surgical hx, Allergies, Family hx, Social hx all reviewed.  Vital Signs BP 182/88 mmHg  Pulse 83  Ht _0  (1.676 m)  Wt  315 lb 12.8 oz (143.246 kg)  BMI 51.00 kg/m2  SpO2 91%  History of Present Illness [name] [name] is a 60 y.o. female with OHS, asthma.  She is concerned about expense for oxygen set up, and wants a POC.  She has been using 2 liters oxygen at night.  She gets occasional cough and congestion.  She uses albuterol 2 to 4 times per week.    She has been getting leg swelling, and gets winded with minimal activity.  Her weight is back up again.  She denies chest pain, fever, or hemoptysis.    Physical Exam  General - No distress ENT - No sinus tenderness, no oral exudate, no LAN, MP 3, 2 + tonsils Cardiac - s1s2 regular, no murmur Chest - No wheeze/rales/dullness Back - No focal tenderness Abd - Soft, non-tender Ext - 1+ ankle edema Neuro - Normal strength Skin - No rashes Psych - normal mood, and behavior   Wt Readings from Last 5 Encounters:  10/27/15 315 lb 12.8 oz (143.246 kg)  07/05/15 314 lb (142.429 kg)  02/04/15 299 lb 3.2 oz (135.716 kg)  01/01/15 306 lb 6.4 oz (138.982 kg)  09/30/14 313 lb 9.6 oz (142.248 kg)     Assessment/Plan  Obesity hypoventilation syndrome with chronic hypoxic/hypercapnic respiratory failure. She has oxygen desaturation with exertion today. Plan: - will have her use  2 liters oxygen during day and night - will arrange for POC  Possible obstructive sleep apnea. Pt declined option for sleep study to further assess, and does not think she could tolerate PAP therapy. Plan: - monitor sleep pattern  Chronic asthmatic bronchitis. Plan: - continue symbicort and prn albuterol  Immunization. Plan: - Prevnar shot today  Morbid obesity. Plan: - encouraged her to keep up her weight loss efforts  Chronic diastolic dysfunction. Her BP was elevated today. Plan: - she is to f/u with PCP and cardiology  Narcotic dealing. Apparently she has been seeking narcotic prescriptions from several physicians. Plan: - defer any narcotic  prescriptions   Patient Instructions  Prevnar shot today  Follow up in 6 months     Chesley Mires, MD [place] [facility] Pager:  [phone]

## 2015-10-27 NOTE — Addendum Note (Signed)
Addended by: Virl Cagey on: 10/27/2015 12:28 PM   Modules accepted: Orders

## 2015-10-27 NOTE — Patient Instructions (Signed)
Prevnar shot today  Follow up in 6 months 

## 2016-01-03 ENCOUNTER — Encounter: Payer: Self-pay | Admitting: *Deleted

## 2016-01-03 DIAGNOSIS — Z96652 Presence of left artificial knee joint: Secondary | ICD-10-CM | POA: Insufficient documentation

## 2016-01-04 ENCOUNTER — Telehealth: Payer: Self-pay | Admitting: Cardiology

## 2016-01-04 ENCOUNTER — Telehealth: Payer: Self-pay | Admitting: Pulmonary Disease

## 2016-01-04 NOTE — Telephone Encounter (Signed)
Spoke with Langley Gauss at Dr Raelyn Mora office, in regards to this pt needing surgical clearance by Dr Meda Coffee, scheduled for 01/11/16.  Informed Langley Gauss that we will be seeing this pt in the office tomorrow 01/05/16 at 0830, in regards to this clearance.  Informed Langley Gauss that once Dr Meda Coffee review and advises on this clearance tomorrow, I will follow-up with her through a fax shortly thereafter.  Langley Gauss verbalized understanding and agrees with this plan.

## 2016-01-04 NOTE — Telephone Encounter (Signed)
lmomtcb for [employer] of named VM

## 2016-01-04 NOTE — Telephone Encounter (Signed)
Request for surgical clearance:  1. What type of surgery is being performed? Left Total replacement   2. When is this surgery scheduled? 01-11-16  3. Are there any medications that need to be held prior to surgery and how long?Aspirin 81 mg-please let her know about that   4. Name of physician performing surgery? Dr Barnabas Lister Pill   5. What is your office phone and fax number?[phone] and fax number is [phone] Att: Aline August  6.

## 2016-01-05 ENCOUNTER — Encounter: Payer: Self-pay | Admitting: Cardiology

## 2016-01-05 ENCOUNTER — Encounter: Payer: Self-pay | Admitting: Pulmonary Disease

## 2016-01-05 ENCOUNTER — Ambulatory Visit (INDEPENDENT_AMBULATORY_CARE_PROVIDER_SITE_OTHER): Payer: 59 | Admitting: Cardiology

## 2016-01-05 VITALS — BP 126/80 | HR 88 | Ht 66.0 in | Wt 326.0 lb

## 2016-01-05 DIAGNOSIS — I10 Essential (primary) hypertension: Secondary | ICD-10-CM

## 2016-01-05 DIAGNOSIS — I5032 Chronic diastolic (congestive) heart failure: Secondary | ICD-10-CM | POA: Diagnosis not present

## 2016-01-05 DIAGNOSIS — I890 Lymphedema, not elsewhere classified: Secondary | ICD-10-CM | POA: Diagnosis not present

## 2016-01-05 DIAGNOSIS — Z01810 Encounter for preprocedural cardiovascular examination: Secondary | ICD-10-CM | POA: Diagnosis not present

## 2016-01-05 DIAGNOSIS — I110 Hypertensive heart disease with heart failure: Secondary | ICD-10-CM | POA: Insufficient documentation

## 2016-01-05 DIAGNOSIS — J9612 Chronic respiratory failure with hypercapnia: Secondary | ICD-10-CM

## 2016-01-05 MED ORDER — FUROSEMIDE 40 MG PO TABS: ORAL_TABLET | ORAL | Status: DC

## 2016-01-05 MED ORDER — AMLODIPINE BESYLATE 5 MG PO TABS: 5.0000 mg | ORAL_TABLET | Freq: Every day | ORAL | Status: AC

## 2016-01-05 NOTE — Telephone Encounter (Signed)
Letter and form for surgery clearance was faxed and placed in Dr Juanetta Gosling scan folder

## 2016-01-05 NOTE — Telephone Encounter (Signed)
Placed in Dr Juanetta Gosling look at to review.  Please advise Dr Halford Chessman. Thanks.

## 2016-01-05 NOTE — Progress Notes (Signed)
Patient ID: MENNA HLADKY, female   DOB: 06-Apr-1956, 60 y.o.   MRN: [id number]     Patient Name: [name] [name] Date of Encounter: 01/05/2016   60 year old patient with h/o HTN, squamous cell cancer of the vulva, s/p resection, currently in remission. The patient is coming complaining of dyspnea on exertion. The patient states that ever since she had her surgery and lymph node removal from both of her groins she developed significant lower extremity edema, became less active and gained a lot of weight. The patient states that she has no disease in the last year worsening dyspnea on exertion, or doing house chores, walking couple flight of stairs, she denies chest pain, palpitations or syncope. She states that her lower extremity edema is trying since her surgery doubling her furosemide from 40-80 mg daily improved gait in the last couple of weeks.  The patient was hospitalized on 04/19/14 with an acute respiratory failure due to COPD exacerbation and CHF exacerbation. She was discharged and is coming after 2 weeks. Her baseline weight is 346 lbs, today 336 lbs, -15 lbs since the discharge.  She started to follow low calorie, low sodium diet.  She denies resting SOB, chest pain. She uses home O2 most nights and sometimes during the day. She sleeps much better.  07/01/2014 - the patient is trying to stick to diabetic diet and started to walk a little bit more. She has lost another 10 pounds that would mean 28 pounds since she was discharged from the hospital. She found a new primary care physician close to her home that she check her labs 2 weeks ago and they were all within normal limits. She denies any chest pain or resting shortness of breath she overall feels much better. She continues have chronic lower extremity edema but it's improved compared to when she was hospitalized.  09/30/2014 - the patient is coming after 3 months, she feels better, uses home O2 at night, her SOB has improved and is able to walk  slightly more. Chest pain when emotionally stressed (her son is going through a separation and drinking problems). Continues to have LE lymphedema.   01/05/2016 6 months follow up, the patient is undergoing a knee surgery the next week on 01/11/2016. She has gained weight and noticed worsening LE edema. Her PCP has increased lasix to 40 mg po BID with some improvement. She otherwise denies CP, palpitation, syncope, PND. Stable dyspnea on moderate exertion and 2 pillow orthopnea. She is motivated to loose weight after the surgery.   CURRENT MEDS  OBJECTIVE  Filed Vitals:   01/05/16 0839  BP: 126/80  Pulse: 88  Height: 5\' 6"  (1.676 m)  Weight: 326 lb (147.873 kg)    Filed Weights   01/05/16 0839  Weight: 326 lb (147.873 kg)    PHYSICAL EXAM  General: Pleasant, NAD. Neuro: Alert and oriented X 3. Moves all extremities spontaneously. Psych: Normal affect. HEENT:  Normal  Neck: Supple without bruits or JVD. Lungs:  Resp regular and unlabored, mild basal crackles Heart: RRR no s3, s4, or murmurs. Abdomen: Soft, non-tender, non-distended, BS + x 4.  Extremities: No clubbing, cyanosis, chronic LE lymphedema. DP/PT/Radials 2+ and equal bilaterally.  Accessory Clinical Findings  Dg Chest Port 1 View  04/21/2014   CLINICAL DATA:  Edema.  EXAM: PORTABLE CHEST - 1 VIEW  COMPARISON:  April 20, 2014.  FINDINGS: Stable cardiomegaly and central pulmonary vascular congestion is noted. No pneumothorax or significant pleural effusion is  noted. Bony thorax is intact.  IMPRESSION: Stable cardiomegaly and central pulmonary vascular congestion.   Electronically Signed   By: Sabino Dick M.D.   On: 04/21/2014 07:36   Dg Chest Port 1 View  04/20/2014   CLINICAL DATA:  Pulmonary edema  EXAM: PORTABLE CHEST - 1 VIEW  COMPARISON:  04/17/2014  FINDINGS: No change in moderate cardiomegaly. There is upper mediastinal widening which is likely from hypoaeration and portable technique.  Pulmonary venous  congestion. No definite interstitial edema. No effusion or pneumothorax. No asymmetric opacity.  IMPRESSION: Cardiomegaly and pulmonary venous congestion.   Electronically Signed   By: Jorje Guild M.D.   On: 04/20/2014 07:02     ASSESSMENT AND PLAN  60 year old female with h/o vulvar squamous cell cancer that was admitted on 8/16 with worsening SOB and was found to be in COPD exacerbation (acute on chronic respiratory failure with hypercapnia) as well as in CHF exacerbation.   1. Acute on Chronic diastolic congestive heart failure - mild fluid overload, we will increase lasix to 80 mg po in the am and 40 mg po in the afternoon until 5/8, then resulme 40 mg po BID.  2. Acute on chronic respiratory failure with hypercapnia - resolved, on home o2,  - we discontinued Carvedilol as she has component of bronchospam   3. Hypertension - controlled, continue the same regimen. Hold ACEI with CKD stage 3   4. Obesity - motivated to loose weight after surgery - 50 lbs by the end of the year.   5. Chronic lymphedema - slightly worse, increasing lasix as stated above   6. Preoperative evaluation - she has no signs of angina, but mild fluid overload, we will increase lasix. She is cleared to go for a knee replacement the next week.   Follow up in 6 months.  Signed, Dorothy Spark MD, [facility] 01/05/2016

## 2016-01-05 NOTE — Patient Instructions (Signed)
Medication Instructions:   DR Meda Coffee WANTS YOU TO TAKE LASIX 80 MG ( 2 TABLETS) IN THE MORNING AND 40 MG (1 TABLET) IN THE AFTERNOON ONLY UNTIL 01/10/16, THEN TAKE LASIX 40 MG BY MOUTH TWICE DAILY THEREAFTER     Follow-Up:  Your physician wants you to follow-up in: [place] will receive a reminder letter in the mail two months in advance. If you don't receive a letter, please call our office to schedule the follow-up appointment.      If you need a refill on your cardiac medications before your next appointment, please call your pharmacy.

## 2016-01-05 NOTE — Telephone Encounter (Signed)
Fax received and placed for Dr Halford Chessman to review.  Will forward to [place] for f/u

## 2016-01-05 NOTE — Telephone Encounter (Signed)
Spoke with Langley Gauss and advised that I asked Dr Halford Chessman if he has seen this form and he has not. She will be refaxing the form.  Will await fax.

## 2016-01-05 NOTE — Telephone Encounter (Signed)
Form completed and letter written

## 2016-01-10 ENCOUNTER — Telehealth: Payer: Self-pay | Admitting: Pulmonary Disease

## 2016-01-10 NOTE — Telephone Encounter (Signed)
Spoke with [name] [name] at [facility]. She is following up on pt's surgical clearance. Per previous documentation, this has already been handled. [name] [name] states that she did not received the fax that was sent last week. Surgical clearance letter and form have been refaxed to [place]. Nothing further was needed.

## 2016-04-05 ENCOUNTER — Telehealth: Payer: Self-pay | Admitting: Cardiology

## 2016-04-05 NOTE — Telephone Encounter (Signed)
New message     Pt c/o swelling: STAT is pt has developed SOB within 24 hours  1. How long have you been experiencing swelling? Since yesterday  2. Where is the swelling located? Legs and stomach area  3.  Are you currently taking a "fluid pill"?yes(lasix and norvasc)  4.  Are you currently SOB? Per nurse yes   5.  Have you traveled recently?no    Pt c/o BP issue: STAT if pt c/o blurred vision, one-sided weakness or slurred speech  1. What are your last 5 BP readings? Today 180/100  2. Are you having any other symptoms (ex. Dizziness, headache, blurred vision, passed out)? no  3. What is your BP issue? no per nurse with home health

## 2016-04-05 NOTE — Telephone Encounter (Signed)
Left Olivia Mackie from [facility] a message to call back.

## 2016-04-12 NOTE — Telephone Encounter (Signed)
Left a message for the pts HHN to call back to follow-up on call placed on 04/05/16 to our office.

## 2016-04-13 NOTE — Telephone Encounter (Signed)
[place] Nurse (with [facility] in [place]) sent orders to Dr Meda Coffee to review and advise on extending this pts Skilled Nursing service for 2 more visits x 2 weeks for disease process management and teaching medication management.  Left a message for home health care RN Caffie Pinto to call back to inform her that Dr Meda Coffee did not initiate the pts [place] orders and she should refer to the ordering provider to advise on request for an extension of a nurse to visit the pt in the home.  Pt had an orthopedic surgery back in May and it appears her [place] orders were provided then at discharge, from a Provider then.  Olivia Mackie at [facility] should refer to pts PCP or original ordering Provider, for this order request,  for continuity of care for the pt.

## 2016-04-14 ENCOUNTER — Telehealth: Payer: Self-pay | Admitting: Cardiology

## 2016-04-14 NOTE — Telephone Encounter (Signed)
Please restart lisinopril 10 mg po daily and call her in a week for repeat BPs.

## 2016-04-14 NOTE — Telephone Encounter (Signed)
Olivia Mackie, the pts HHN from [facility], states that this is the second time she has called this office to report that the pts BP is elevated.   She reports that today the pts BP was 170/100.  Olivia Mackie states that the pt advised her that her surgeon told her to stop taking Lisinopril when she had left total knee arthroplasty in May of this year.  I advised Olivia Mackie that Lisinopril is not listed in the pts current medications list and that it was removed on 04/19/14 according to the pts medications history.  Olivia Mackie states that she will contact the pts PCP to ask if they prescribed Lisinopril for the pt. Olivia Mackie states that she will call us back if she has any more questions.

## 2016-04-14 NOTE — Telephone Encounter (Signed)
Per RN Linus Orn with [facility]  Needs to speak to someone about pt's Elevated BP.  RN needs to speak to someone today

## 2016-04-17 MED ORDER — LISINOPRIL 10 MG PO TABS: 10.0000 mg | ORAL_TABLET | Freq: Every day | ORAL | 3 refills | Status: DC

## 2016-04-17 NOTE — Telephone Encounter (Signed)
Left a message for [name] [name] with [facility] to call back to endorse Dr Francesca Oman recommendations for the pt to start back taking lisinopril 10 mg po daily, then check her BP x 1 week, and call these values into the office for Dr Meda Coffee to review. Also left a message on Tracy's VM that the pts Home health orders for skilled nursing to be extended, should be faxed and advised on by the original Provider who ordered the pt to have [place].  Also informed Claiborne Billings at [facility] that myself and multiple parties within our office have been trying to make contact with [name] [name], but unfortunately the contact information she provided [place] to call her at is incorrect.

## 2016-04-17 NOTE — Addendum Note (Signed)
Addended by: Nuala Alpha on: 04/17/2016 06:14 PM   Modules accepted: Orders

## 2016-04-17 NOTE — Telephone Encounter (Signed)
Left a detailed message on [place] with [facility], VM about Dr Francesca Oman recommendations and to call back with any additional questions regarding this information.  Left a detailed message that new orders were given to the pt as well.

## 2016-04-17 NOTE — Telephone Encounter (Signed)
Spoke with the pt to inform her that we have been trying to make contact with her [place], with no success.  Informed the pt that it appears that we were given the incorrect contact information to reach [facility] with [facility], given the 1st time she called into the office and provided this information. Informed the pt that we were trying to inform her HHN that per Dr Meda Coffee, she reviewed the BP readings called into our office, and recommends that we add back lisinopril 10 mg po daily to her regimen, and have Olivia Mackie RN take her BP for a week, or for how many times she goes into the pts home to see her, and log this information.   Dr Meda Coffee would then like for Olivia Mackie RN to call back into our office for around the later part of next week, and report these recorded values. Informed the pt that I will call this medication into her confirmed pharmacy of choice.  Advised the pt to start taking this tomorrow.  Pt verbalized understanding and agrees with this plan. Pt also states she will endorse these changes to her [facility], for she will be coming into the home to see the pt on this Wednesday 8/16.  Pt provided me with Tracy's contact number at [phone]. Informed the pt that I will place a call into Tracy's contact number provided, and leave her a detailed message about Dr Francesca Oman recommendations, and have her call back to confirm this, or to call with any additional questions.  Pt verbalized understanding, agrees with this plan, and gracious for all the assistance provided.  Pt states her BP has improved today, at 140/76.  Pt states that she felt her BP was elevated due to pain, which she received a cortisone shot for.  Pt after receiving her cortisone shot,  she then noticed her BP improving.

## 2016-04-24 ENCOUNTER — Ambulatory Visit: Payer: Medicare HMO | Admitting: Pulmonary Disease

## 2016-05-17 ENCOUNTER — Ambulatory Visit: Payer: 59 | Admitting: Pulmonary Disease

## 2016-06-07 ENCOUNTER — Encounter: Payer: Self-pay | Admitting: Cardiology

## 2016-06-21 ENCOUNTER — Ambulatory Visit (INDEPENDENT_AMBULATORY_CARE_PROVIDER_SITE_OTHER): Payer: Medicare HMO | Admitting: Cardiology

## 2016-06-21 ENCOUNTER — Encounter: Payer: Self-pay | Admitting: Cardiology

## 2016-06-21 VITALS — BP 106/70 | HR 87 | Ht 66.0 in | Wt 314.0 lb

## 2016-06-21 DIAGNOSIS — E785 Hyperlipidemia, unspecified: Secondary | ICD-10-CM | POA: Diagnosis not present

## 2016-06-21 DIAGNOSIS — I5032 Chronic diastolic (congestive) heart failure: Secondary | ICD-10-CM | POA: Diagnosis not present

## 2016-06-21 DIAGNOSIS — I110 Hypertensive heart disease with heart failure: Secondary | ICD-10-CM

## 2016-06-21 DIAGNOSIS — I890 Lymphedema, not elsewhere classified: Secondary | ICD-10-CM

## 2016-06-21 DIAGNOSIS — I5033 Acute on chronic diastolic (congestive) heart failure: Secondary | ICD-10-CM

## 2016-06-21 DIAGNOSIS — I10 Essential (primary) hypertension: Secondary | ICD-10-CM

## 2016-06-21 LAB — LIPID PANEL
Cholesterol: 192 mg/dL (ref 125–200)
HDL: 47 mg/dL (ref >=46)
LDL Cholesterol: 119 mg/dL (ref <130)
Total CHOL/HDL Ratio: 4.1 Ratio (ref <=5.0)
Triglycerides: 128 mg/dL (ref <150)
VLDL: 26 mg/dL (ref <30)

## 2016-06-21 LAB — COMPREHENSIVE METABOLIC PANEL
ALT: 11 U/L (ref 6–29)
AST: 10 U/L (ref 10–35)
Albumin: 3.8 g/dL (ref 3.6–5.1)
Alkaline Phosphatase: 82 U/L (ref 33–130)
BUN: 28 mg/dL — ABNORMAL HIGH (ref 7–25)
CO2: 28 mmol/L (ref 20–31)
Calcium: 9.3 mg/dL (ref 8.6–10.4)
Chloride: 101 mmol/L (ref 98–110)
Creat: 1.32 mg/dL — ABNORMAL HIGH (ref 0.50–1.05)
Glucose, Bld: 107 mg/dL — ABNORMAL HIGH (ref 65–99)
Potassium: 4.4 mmol/L (ref 3.5–5.3)
Sodium: 136 mmol/L (ref 135–146)
Total Bilirubin: 0.4 mg/dL (ref 0.2–1.2)
Total Protein: 6.5 g/dL (ref 6.1–8.1)

## 2016-06-21 MED ORDER — LISINOPRIL 5 MG PO TABS: 5.0000 mg | ORAL_TABLET | Freq: Every day | ORAL | 3 refills | Status: DC

## 2016-06-21 NOTE — Patient Instructions (Signed)
Medication Instructions:   DECREASE YOUR LISINOPRIL TO 5 MG ONCE DAILY    Labwork:  TODAY--CMET AND LIPIDS   Follow-Up:  Your physician wants you to follow-up in: [place] will receive a reminder letter in the mail two months in advance. If you don't receive a letter, please call our office to schedule the follow-up appointment.      If you need a refill on your cardiac medications before your next appointment, please call your pharmacy.

## 2016-06-21 NOTE — Progress Notes (Signed)
Patient ID: [name] [name], female   DOB: 03-11-1956, 60 y.o.   MRN: [id number]     Patient Name: [name] [name] Date of Encounter: 06/21/2016   59 year old patient with h/o HTN, squamous cell cancer of the vulva, s/p resection, currently in remission. The patient is coming complaining of dyspnea on exertion. The patient states that ever since she had her surgery and lymph node removal from both of her groins she developed significant lower extremity edema, became less active and gained a lot of weight. The patient states that she has no disease in the last year worsening dyspnea on exertion, or doing house chores, walking couple flight of stairs, she denies chest pain, palpitations or syncope. She states that her lower extremity edema is trying since her surgery doubling her furosemide from 40-80 mg daily improved gait in the last couple of weeks.  The patient was hospitalized on 04/19/14 with an acute respiratory failure due to COPD exacerbation and CHF exacerbation. She was discharged and is coming after 2 weeks. Her baseline weight is 346 lbs, today 336 lbs, -15 lbs since the discharge.  She started to follow low calorie, low sodium diet.  She denies resting SOB, chest pain. She uses home O2 most nights and sometimes during the day. She sleeps much better.  07/01/2014 - the patient is trying to stick to diabetic diet and started to walk a little bit more. She has lost another 10 pounds that would mean 28 pounds since she was discharged from the hospital. She found a new primary care physician close to her home that she check her labs 2 weeks ago and they were all within normal limits. She denies any chest pain or resting shortness of breath she overall feels much better. She continues have chronic lower extremity edema but it's improved compared to when she was hospitalized.  06/21/2016 - 6 months follow-up, the patient underwent left knee replacement in May with complication of infection that is now  resolved. She feels well she walks somewhat and is on weight loss diet she has lost 12 pounds since May and feels better. Her lymphedema has improved. She denies orthopnea paroxysmal nocturnal dyspnea and no chest pain stable dyspnea on exertion.   CURRENT MEDS  OBJECTIVE  Vitals:   06/21/16 0924  BP: 106/70  Pulse: 87  SpO2: 97%  Weight: (!) 314 lb (142.4 kg)  Height: 5\' 6"  (1.676 m)    Filed Weights   06/21/16 0924  Weight: (!) 314 lb (142.4 kg)    PHYSICAL EXAM  General: Pleasant, NAD. Neuro: Alert and oriented X 3. Moves all extremities spontaneously. Psych: Normal affect. HEENT:  Normal  Neck: Supple without bruits or JVD. Lungs:  Resp regular and unlabored, mild basal crackles Heart: RRR no s3, s4, or murmurs. Abdomen: Soft, non-tender, non-distended, BS + x 4.  Extremities: No clubbing, cyanosis, chronic LE lymphedema. DP/PT/Radials 2+ and equal bilaterally.  Accessory Clinical Findings  Dg Chest Port 1 View  04/21/2014   CLINICAL DATA:  Edema.  EXAM: PORTABLE CHEST - 1 VIEW  COMPARISON:  April 20, 2014.  FINDINGS: Stable cardiomegaly and central pulmonary vascular congestion is noted. No pneumothorax or significant pleural effusion is noted. Bony thorax is intact.  IMPRESSION: Stable cardiomegaly and central pulmonary vascular congestion.   Electronically Signed   By: Sabino Dick M.D.   On: 04/21/2014 07:36   Dg Chest Port 1 View  04/20/2014   CLINICAL DATA:  Pulmonary edema  EXAM: PORTABLE  CHEST - 1 VIEW  COMPARISON:  04/17/2014  FINDINGS: No change in moderate cardiomegaly. There is upper mediastinal widening which is likely from hypoaeration and portable technique.  Pulmonary venous congestion. No definite interstitial edema. No effusion or pneumothorax. No asymmetric opacity.  IMPRESSION: Cardiomegaly and pulmonary venous congestion.   Electronically Signed   By: Jorje Guild M.D.   On: 04/20/2014 07:02   EKG performed today 06/21/2016 showed normal sinus  rhythm and it's normal EKG unchanged from prior.   ASSESSMENT AND PLAN  60 year old female with h/o vulvar squamous cell cancer that was admitted on 8/16 with worsening SOB and was found to be in COPD exacerbation (acute on chronic respiratory failure with hypercapnia) as well as in CHF exacerbation.   1. Acute on Chronic diastolic congestive heart failure - euvolemic, lost weight, LE edema sec to lymphedema improved, continue Lasix 40 mg by mouth twice a day.  2. Acute on chronic respiratory failure with hypercapnia - resolved, off home o2, O2 sats today ok. - we discontinued Carvedilol as she has component of bronchospam   3. Hypertension - controlled, now it orthostatic hypotension, decrease lisinopril to 5 mg daily. Repeat CMP today to check creatinine. CKD stage 3   4. Obesity - motivated to loose weight after surgery, she has lost 12 pounds since May she is congratulated and encouraged to continuing to trend.  5. Chronic lymphedema - improved after losing weight.   Follow up in 6 months. CMP, lipids today.  Signed, Ena Dawley MD, [facility] 06/21/2016

## 2016-07-04 ENCOUNTER — Telehealth: Payer: Self-pay | Admitting: Cardiology

## 2016-07-04 NOTE — Telephone Encounter (Signed)
[name] [name] is calling to get her lab results . Please call   Thanks

## 2016-07-04 NOTE — Telephone Encounter (Signed)
I spoke with pt and reviewed lab results with her.

## 2016-07-18 DIAGNOSIS — N3946 Mixed incontinence: Secondary | ICD-10-CM | POA: Insufficient documentation

## 2016-08-22 DIAGNOSIS — G4733 Obstructive sleep apnea (adult) (pediatric): Secondary | ICD-10-CM | POA: Insufficient documentation

## 2016-09-13 DIAGNOSIS — F411 Generalized anxiety disorder: Secondary | ICD-10-CM | POA: Insufficient documentation

## 2016-09-13 DIAGNOSIS — F3342 Major depressive disorder, recurrent, in full remission: Secondary | ICD-10-CM | POA: Insufficient documentation

## 2016-10-17 DIAGNOSIS — J3489 Other specified disorders of nose and nasal sinuses: Secondary | ICD-10-CM | POA: Insufficient documentation

## 2016-10-20 ENCOUNTER — Other Ambulatory Visit: Payer: Self-pay | Admitting: *Deleted

## 2016-10-20 DIAGNOSIS — E785 Hyperlipidemia, unspecified: Secondary | ICD-10-CM

## 2016-10-20 DIAGNOSIS — I5032 Chronic diastolic (congestive) heart failure: Secondary | ICD-10-CM

## 2016-10-20 DIAGNOSIS — I10 Essential (primary) hypertension: Secondary | ICD-10-CM

## 2016-10-20 MED ORDER — LISINOPRIL 5 MG PO TABS: 5.0000 mg | ORAL_TABLET | Freq: Every day | ORAL | 1 refills | Status: DC

## 2016-10-23 ENCOUNTER — Telehealth: Payer: Self-pay | Admitting: Cardiology

## 2016-10-23 DIAGNOSIS — I270 Primary pulmonary hypertension: Secondary | ICD-10-CM

## 2016-10-23 NOTE — Telephone Encounter (Signed)
The pt c/o a 10 lb weight gain in 3 weeks, SOB (on 3L of O2 24 hours a day at this time), edema in legs and feet with redness and coughing (using nebulizer now). She reports that she is taking Lasix 80 mg in the am and 40 mg in the PM.  She wants to know what she should do as her family is very concerned because she does have CHF.  She is advised that I am going to discuss with our DOD, Dr Tamala Julian and that I will call her back with his recommendations.

## 2016-10-23 NOTE — Telephone Encounter (Signed)
**Note De-Identified [name] [name] Obfuscation** Per Dr Tamala Julian the pt is advised to increase her Lasix to 80 mg BID X 2 days then decrease back to 80 mg in the am and 40 mg in the am there after and to have a BMET on Thursday this week (2/22).  The pt verbalized understanding and is in agreement with the plan. BMET ordered and scheduled to be drawn on 2/22.   Will forward message to Dr Meda Coffee and her nurse as Juluis Rainier.

## 2016-10-23 NOTE — Telephone Encounter (Signed)
[name] [name] is calling because she has been having problems with fluid in her legs and feet . Both her legs are red and swollen . Her oxygen levels has been going up and down , has had to use her oxygen to go to the bathroom . Please call    Thanks

## 2016-10-26 ENCOUNTER — Telehealth: Payer: Self-pay | Admitting: *Deleted

## 2016-10-26 ENCOUNTER — Other Ambulatory Visit: Payer: Medicare HMO | Admitting: *Deleted

## 2016-10-26 ENCOUNTER — Telehealth: Payer: Self-pay | Admitting: Cardiology

## 2016-10-26 DIAGNOSIS — I270 Primary pulmonary hypertension: Secondary | ICD-10-CM

## 2016-10-26 DIAGNOSIS — E875 Hyperkalemia: Secondary | ICD-10-CM

## 2016-10-26 LAB — BASIC METABOLIC PANEL
BUN/Creatinine Ratio: 26 (ref 12–28)
BUN: 32 mg/dL — ABNORMAL HIGH (ref 8–27)
CO2: 32 mmol/L — ABNORMAL HIGH (ref 18–29)
Calcium: 9.4 mg/dL (ref 8.7–10.3)
Chloride: 88 mmol/L — ABNORMAL LOW (ref 96–106)
Creatinine, Ser: 1.23 mg/dL — ABNORMAL HIGH (ref 0.57–1.00)
GFR calc Af Amer: 55 — ABNORMAL LOW (ref >59)
GFR calc non Af Amer: 48 — ABNORMAL LOW (ref >59)
Glucose: 104 mg/dL — ABNORMAL HIGH (ref 65–99)
Potassium: 5.3 mmol/L — ABNORMAL HIGH (ref 3.5–5.2)
Sodium: 133 mmol/L — ABNORMAL LOW (ref 134–144)

## 2016-10-26 NOTE — Telephone Encounter (Signed)
-----   Message from Dorothy Spark, MD sent at 10/26/2016  3:59 PM EST ----- Elevated K, discontinue lisinopril, repeat on 2/26 when she is in the clinic

## 2016-10-26 NOTE — Telephone Encounter (Signed)
Pt calling to ask if her lab results are back from this afternoon.  Informed the pt that they are not back yet, and the turn around time will be early tomorrow morning.  Informed the pt that once they are complete and resulted by Dr Meda Coffee, I will call her shortly thereafter.  Pt verbalized understanding and agrees with this plan.

## 2016-10-26 NOTE — Telephone Encounter (Signed)
Notified the pt of BMET result per Dr Meda Coffee.  Informed hte pt that she has elevated K, and she needs to discontinue lisinopril, and we will repeat another BMET on 2/26 when she is in the clinic.  Pt verbalized understanding and agrees with this plan.

## 2016-10-26 NOTE — Telephone Encounter (Signed)
New message ° ° °Pt verbalized that she is calling for lab results  °

## 2016-10-30 ENCOUNTER — Ambulatory Visit (INDEPENDENT_AMBULATORY_CARE_PROVIDER_SITE_OTHER): Payer: Medicare HMO | Admitting: Cardiology

## 2016-10-30 ENCOUNTER — Inpatient Hospital Stay (HOSPITAL_COMMUNITY)
Admission: AD | Admit: 2016-10-30 | Discharge: 2016-11-03 | DRG: 291 | Disposition: A | Discharge status: Home / Self Care | Payer: Medicare HMO | Source: Ambulatory Visit | Attending: Cardiology | Admitting: Cardiology

## 2016-10-30 ENCOUNTER — Other Ambulatory Visit (HOSPITAL_COMMUNITY): Payer: Medicare HMO

## 2016-10-30 ENCOUNTER — Encounter (HOSPITAL_COMMUNITY): Payer: Self-pay | Admitting: *Deleted

## 2016-10-30 ENCOUNTER — Other Ambulatory Visit: Payer: Medicare HMO | Admitting: *Deleted

## 2016-10-30 VITALS — BP 132/80 | HR 95 | Ht 66.0 in | Wt 358.0 lb

## 2016-10-30 DIAGNOSIS — Z7984 Long term (current) use of oral hypoglycemic drugs: Secondary | ICD-10-CM | POA: Diagnosis not present

## 2016-10-30 DIAGNOSIS — I5032 Chronic diastolic (congestive) heart failure: Secondary | ICD-10-CM

## 2016-10-30 DIAGNOSIS — Z880 Allergy status to penicillin: Secondary | ICD-10-CM

## 2016-10-30 DIAGNOSIS — Z882 Allergy status to sulfonamides status: Secondary | ICD-10-CM | POA: Diagnosis not present

## 2016-10-30 DIAGNOSIS — I110 Hypertensive heart disease with heart failure: Principal | ICD-10-CM | POA: Diagnosis present

## 2016-10-30 DIAGNOSIS — Z8589 Personal history of malignant neoplasm of other organs and systems: Secondary | ICD-10-CM

## 2016-10-30 DIAGNOSIS — J9622 Acute and chronic respiratory failure with hypercapnia: Secondary | ICD-10-CM

## 2016-10-30 DIAGNOSIS — J9621 Acute and chronic respiratory failure with hypoxia: Secondary | ICD-10-CM

## 2016-10-30 DIAGNOSIS — Z7982 Long term (current) use of aspirin: Secondary | ICD-10-CM

## 2016-10-30 DIAGNOSIS — I509 Heart failure, unspecified: Secondary | ICD-10-CM | POA: Diagnosis not present

## 2016-10-30 DIAGNOSIS — R0600 Dyspnea, unspecified: Secondary | ICD-10-CM | POA: Diagnosis present

## 2016-10-30 DIAGNOSIS — Z79899 Other long term (current) drug therapy: Secondary | ICD-10-CM | POA: Diagnosis not present

## 2016-10-30 DIAGNOSIS — Z7952 Long term (current) use of systemic steroids: Secondary | ICD-10-CM

## 2016-10-30 DIAGNOSIS — I5033 Acute on chronic diastolic (congestive) heart failure: Secondary | ICD-10-CM

## 2016-10-30 DIAGNOSIS — Z6841 Body Mass Index (BMI) 40.0 and over, adult: Secondary | ICD-10-CM

## 2016-10-30 DIAGNOSIS — E662 Morbid (severe) obesity with alveolar hypoventilation: Secondary | ICD-10-CM | POA: Diagnosis not present

## 2016-10-30 DIAGNOSIS — Z96652 Presence of left artificial knee joint: Secondary | ICD-10-CM | POA: Diagnosis present

## 2016-10-30 DIAGNOSIS — Z9981 Dependence on supplemental oxygen: Secondary | ICD-10-CM | POA: Diagnosis not present

## 2016-10-30 DIAGNOSIS — E875 Hyperkalemia: Secondary | ICD-10-CM

## 2016-10-30 DIAGNOSIS — I890 Lymphedema, not elsewhere classified: Secondary | ICD-10-CM | POA: Diagnosis present

## 2016-10-30 LAB — GLUCOSE, CAPILLARY
Glucose-Capillary: 115 mg/dL — ABNORMAL HIGH (ref 65–99)
Glucose-Capillary: 173 mg/dL — ABNORMAL HIGH (ref 65–99)

## 2016-10-30 LAB — BASIC METABOLIC PANEL
BUN/Creatinine Ratio: 32 — ABNORMAL HIGH (ref 12–28)
BUN: 48 mg/dL — ABNORMAL HIGH (ref 8–27)
CO2: 30 mmol/L — ABNORMAL HIGH (ref 18–29)
Calcium: 9.3 mg/dL (ref 8.7–10.3)
Chloride: 90 mmol/L — ABNORMAL LOW (ref 96–106)
Creatinine, Ser: 1.5 mg/dL — ABNORMAL HIGH (ref 0.57–1.00)
GFR calc Af Amer: 43 mL/min/{1.73_m2} — ABNORMAL LOW (ref >59)
GFR calc non Af Amer: 38 mL/min/{1.73_m2} — ABNORMAL LOW (ref >59)
Glucose: 92 mg/dL (ref 65–99)
Potassium: 5.2 mmol/L (ref 3.5–5.2)
Sodium: 136 mmol/L (ref 134–144)

## 2016-10-30 LAB — COMPREHENSIVE METABOLIC PANEL
ALBUMIN: 3.3 g/dL — AB (ref 3.5–5.0)
ALT: 19 U/L (ref 14–54)
ANION GAP: 8 (ref 5–15)
AST: 13 U/L — AB (ref 15–41)
Alkaline Phosphatase: 77 U/L (ref 38–126)
BILIRUBIN TOTAL: 0.3 mg/dL (ref 0.3–1.2)
BUN: 47 mg/dL — AB (ref 6–20)
CALCIUM: 9.6 mg/dL (ref 8.9–10.3)
CO2: 36 mmol/L — AB (ref 22–32)
Chloride: 92 mmol/L — ABNORMAL LOW (ref 101–111)
Creatinine, Ser: 1.47 mg/dL — ABNORMAL HIGH (ref 0.44–1.00)
GFR calc Af Amer: 44 mL/min — ABNORMAL LOW (ref >60)
GFR calc non Af Amer: 38 mL/min — ABNORMAL LOW (ref >60)
GLUCOSE: 92 mg/dL (ref 65–99)
POTASSIUM: 5 mmol/L (ref 3.5–5.1)
SODIUM: 136 mmol/L (ref 135–145)
TOTAL PROTEIN: 6.8 g/dL (ref 6.5–8.1)

## 2016-10-30 LAB — CBC WITH DIFFERENTIAL/PLATELET
BASOS ABS: 0 10*3/uL (ref 0.0–0.1)
Basophils Relative: 0 %
EOS PCT: 1 %
Eosinophils Absolute: 0.1 10*3/uL (ref 0.0–0.7)
HCT: 36.3 % (ref 36.0–46.0)
Hemoglobin: 10.4 g/dL — ABNORMAL LOW (ref 12.0–15.0)
LYMPHS PCT: 28 %
Lymphs Abs: 1.5 10*3/uL (ref 0.7–4.0)
MCH: 24.4 pg — ABNORMAL LOW (ref 26.0–34.0)
MCHC: 28.7 g/dL — AB (ref 30.0–36.0)
MCV: 85 fL (ref 78.0–100.0)
MONOS PCT: 7 %
Monocytes Absolute: 0.4 10*3/uL (ref 0.1–1.0)
Neutro Abs: 3.5 10*3/uL (ref 1.7–7.7)
Neutrophils Relative %: 64 %
PLATELETS: 282 10*3/uL (ref 150–400)
RBC: 4.27 MIL/uL (ref 3.87–5.11)
RDW: 16.2 % — AB (ref 11.5–15.5)
WBC: 5.5 10*3/uL (ref 4.0–10.5)

## 2016-10-30 LAB — TSH: TSH: 1.507 u[IU]/mL (ref 0.350–4.500)

## 2016-10-30 LAB — MAGNESIUM: Magnesium: 1.9 mg/dL (ref 1.7–2.4)

## 2016-10-30 LAB — BRAIN NATRIURETIC PEPTIDE: B Natriuretic Peptide: 163.9 pg/mL — ABNORMAL HIGH (ref 0.0–100.0)

## 2016-10-30 MED ORDER — ONDANSETRON HCL 4 MG/2ML IJ SOLN: 4.0000 mg | Freq: Four times a day (QID) | INTRAMUSCULAR | Status: DC | PRN

## 2016-10-30 MED ORDER — METOLAZONE 2.5 MG PO TABS
2.5000 mg | ORAL_TABLET | Freq: Every day | ORAL | Status: DC
  Administered 2016-10-30 – 2016-11-03 (×5): 2.5 mg via ORAL
  Filled 2016-10-30 (×5): qty 1

## 2016-10-30 MED ORDER — AMLODIPINE BESYLATE 5 MG PO TABS
5.0000 mg | ORAL_TABLET | Freq: Every day | ORAL | Status: DC
  Administered 2016-10-31 – 2016-11-03 (×4): 5 mg via ORAL
  Filled 2016-10-30 (×4): qty 1

## 2016-10-30 MED ORDER — ALPRAZOLAM 0.5 MG PO TABS: 0.5000 mg | ORAL_TABLET | Freq: Three times a day (TID) | ORAL | Status: DC | PRN

## 2016-10-30 MED ORDER — SODIUM CHLORIDE 0.9 % IV SOLN: 250.0000 mL | INTRAVENOUS | Status: DC | PRN

## 2016-10-30 MED ORDER — OXYBUTYNIN CHLORIDE ER 10 MG PO TB24
10.0000 mg | ORAL_TABLET | Freq: Every day | ORAL | Status: DC
  Administered 2016-10-31 – 2016-11-03 (×4): 10 mg via ORAL
  Filled 2016-10-30 (×4): qty 1

## 2016-10-30 MED ORDER — FUROSEMIDE 10 MG/ML IJ SOLN
80.0000 mg | Freq: Two times a day (BID) | INTRAMUSCULAR | Status: DC
  Administered 2016-10-30 – 2016-11-01 (×6): 80 mg via INTRAVENOUS
  Filled 2016-10-30 (×7): qty 8

## 2016-10-30 MED ORDER — SODIUM CHLORIDE 0.9% FLUSH
3.0000 mL | Freq: Two times a day (BID) | INTRAVENOUS | Status: DC
  Administered 2016-10-30 – 2016-11-03 (×9): 3 mL via INTRAVENOUS

## 2016-10-30 MED ORDER — PRAVASTATIN SODIUM 20 MG PO TABS
20.0000 mg | ORAL_TABLET | Freq: Every day | ORAL | Status: DC
  Administered 2016-10-31 – 2016-11-03 (×4): 20 mg via ORAL
  Filled 2016-10-30 (×5): qty 1

## 2016-10-30 MED ORDER — ALLOPURINOL 300 MG PO TABS
300.0000 mg | ORAL_TABLET | Freq: Every day | ORAL | Status: DC
  Administered 2016-10-31 – 2016-11-03 (×4): 300 mg via ORAL
  Filled 2016-10-30 (×5): qty 1

## 2016-10-30 MED ORDER — PANTOPRAZOLE SODIUM 40 MG PO TBEC
40.0000 mg | DELAYED_RELEASE_TABLET | Freq: Every day | ORAL | Status: DC
  Administered 2016-10-31 – 2016-11-03 (×4): 40 mg via ORAL
  Filled 2016-10-30 (×5): qty 1

## 2016-10-30 MED ORDER — ACETAMINOPHEN 325 MG PO TABS
650.0000 mg | ORAL_TABLET | ORAL | Status: DC | PRN
Start: 2016-10-30 — End: 2016-11-03

## 2016-10-30 MED ORDER — AMITRIPTYLINE HCL 10 MG PO TABS
10.0000 mg | ORAL_TABLET | Freq: Every day | ORAL | Status: DC
  Administered 2016-10-30 – 2016-11-02 (×4): 10 mg via ORAL
  Filled 2016-10-30 (×4): qty 1

## 2016-10-30 MED ORDER — MOMETASONE FURO-FORMOTEROL FUM 200-5 MCG/ACT IN AERO
2.0000 | INHALATION_SPRAY | Freq: Two times a day (BID) | RESPIRATORY_TRACT | Status: DC
  Administered 2016-10-30 – 2016-11-03 (×8): 2 via RESPIRATORY_TRACT
  Filled 2016-10-30: qty 8.8

## 2016-10-30 MED ORDER — HEPARIN SODIUM (PORCINE) 5000 UNIT/ML IJ SOLN
5000.0000 [IU] | Freq: Three times a day (TID) | INTRAMUSCULAR | Status: DC
  Administered 2016-10-30 – 2016-11-03 (×11): 5000 [IU] via SUBCUTANEOUS
  Filled 2016-10-30 (×11): qty 1

## 2016-10-30 MED ORDER — ALBUTEROL SULFATE (2.5 MG/3ML) 0.083% IN NEBU
2.5000 mg | INHALATION_SOLUTION | RESPIRATORY_TRACT | Status: DC
  Administered 2016-10-30 (×2): 2.5 mg via RESPIRATORY_TRACT
  Filled 2016-10-30 (×3): qty 3

## 2016-10-30 MED ORDER — SODIUM CHLORIDE 0.9% FLUSH
3.0000 mL | INTRAVENOUS | Status: DC | PRN
  Administered 2016-11-03: 3 mL via INTRAVENOUS
  Filled 2016-10-30: qty 3

## 2016-10-30 MED ORDER — METFORMIN HCL 500 MG PO TABS
500.0000 mg | ORAL_TABLET | Freq: Two times a day (BID) | ORAL | Status: DC
  Administered 2016-10-30 – 2016-11-03 (×8): 500 mg via ORAL
  Filled 2016-10-30 (×8): qty 1

## 2016-10-30 NOTE — Patient Instructions (Signed)
Medication Instructions:   Your physician recommends that you continue on your current medications as directed. Please refer to the Current Medication list given to you today.    Follow-Up:  PER DR NELSON YOU ARE BEING DIRECT ADMIT TO [place] [address] TELEMETRY FLOOR TODAY-FOR CHF---PLEASE REPORT TO THE MAIN ENTRANCE [address] AT [place].  THEY WILL TAKE YOU DIRECTLY TO YOUR BED THEREAFTER.        If you need a refill on your cardiac medications before your next appointment, please call your pharmacy.

## 2016-10-30 NOTE — H&P (Addendum)
Patient Name: [name] [name] Date of Encounter: 10/30/2016   61 year old patient with h/o HTN, squamous cell cancer of the vulva, s/p resection, currently in remission. The patient is coming complaining of dyspnea on exertion. The patient states that ever since she had her surgery and lymph node removal from both of her groins she developed significant lower extremity edema, became less active and gained a lot of weight. The patient states that she has no disease in the last year worsening dyspnea on exertion, or doing house chores, walking couple flight of stairs, she denies chest pain, palpitations or syncope.  The patient was hospitalized on 04/19/14 with an acute respiratory failure due to COPD exacerbation and CHF exacerbation. She was discharged and is coming after 2 weeks. She was discharged on home O2.   She uses home O2 most nights and sometimes during the day. She sleeps much better. She underwent left knee replacement in May 0000000  Complicated byan infection. She was able to start walking and lost down to 314 lbs in 06/2016.   11/27/2016 - the patient is coming after 6 months, she was doing well until 3 weeks ago when she developed shortness of breath, productive cough, but no fever or chills, and was treated by her primary care physician for pneumonia with antibiotics. The patient states that she initially felt better with her productive cough changing to nonproductive and her antibiotics were discontinued. However in the last week she has developed worsening lower extremity edema, 3 pillow orthopnea, paroxysmal nocturnal dyspnea and worsening shortness of breath at rest. She is unable to sleep. She has gained 44 pounds since October.  CURRENT MEDS  OBJECTIVE     Vitals:   10/30/16 1005  BP: 132/80  Pulse: 95  Weight: (!) 358 lb (162.4 kg)  Height: 5\' 6"  (1.676 m)       Filed Weights   10/30/16 1005  Weight: (!) 358 lb (162.4 kg)    PHYSICAL EXAM  General:  Pleasant, Labored breathing on home oxygen. SPO2 90% Neuro: Alert and oriented X 3. Moves all extremities spontaneously. Psych: Normal affect. HEENT:  Normal          Neck: Supple without bruits or JVD. Lungs:  Resp regular and unlabored, bilateral basilar crackles Heart: RRR no s3, s4, or murmurs. Abdomen: Soft, non-tender, non-distended, BS + x 4.  Extremities: No clubbing, cyanosis, + 3 LE lymphedema . DP/PT/Radials 2+ and equal bilaterally.  Accessory Clinical Findings  Dg Chest Port 1 View  04/21/2014   CLINICAL DATA:  Edema.  EXAM: PORTABLE CHEST - 1 VIEW  COMPARISON:  April 20, 2014.  FINDINGS: Stable cardiomegaly and central pulmonary vascular congestion is noted. No pneumothorax or significant pleural effusion is noted. Bony thorax is intact.  IMPRESSION: Stable cardiomegaly and central pulmonary vascular congestion.   Electronically Signed   By: Sabino Dick M.D.   On: 04/21/2014 07:36   Dg Chest Port 1 View  04/20/2014   CLINICAL DATA:  Pulmonary edema  EXAM: PORTABLE CHEST - 1 VIEW  COMPARISON:  04/17/2014  FINDINGS: No change in moderate cardiomegaly. There is upper mediastinal widening which is likely from hypoaeration and portable technique.  Pulmonary venous congestion. No definite interstitial edema. No effusion or pneumothorax. No asymmetric opacity.  IMPRESSION: Cardiomegaly and pulmonary venous congestion.   Electronically Signed   By: Jorje Guild M.D.   On: 04/20/2014 07:02   EKG performed today 06/21/2016 showed normal sinus rhythm and it's normal EKG  unchanged from prior.    ASSESSMENT AND PLAN  61 year old female with h/o vulvar squamous cell cancer with known diastolic CHF, morbid obesity-ventilation syndrome, on home oxygen therapy but presented with acute on chronic diastolic CHF and acute on chronic respiratory failure with hypoxia.  1. Acute on Chronic diastolic congestive heart failure - the patient has gained 44 pounds since October, she is  hypoxic, we will admit to the hospital and starts Lasix 80 mg IV twice a day and add metolazone 2.5 mg daily.  2. Acute on chronic respiratory failure with hypercapnia and hypoxia, SpO2 today 90% on 4 L oxygen. - we discontinued Carvedilol in the past as she has component of bronchospam   3. Hypertension - controlled.   4. Obesity - hypoventilation syndrome, lost weight the lats year.  5. Chronic lymphedema - improved after losing weight. Now worsening.   We will admit to telemetry, check BNP, CMP, TSH, CBC today. Check CXR.   Ena Dawley, MD 10/30/2016

## 2016-10-30 NOTE — Progress Notes (Signed)
Patient ID: LEIRA CARLSTEDT, female   DOB: 01/01/1956, 61 y.o.   MRN: [id number]     Patient Name: [name] [name] Date of Encounter: 10/30/2016   61 year old patient with h/o HTN, squamous cell cancer of the vulva, s/p resection, currently in remission. The patient is coming complaining of dyspnea on exertion. The patient states that ever since she had her surgery and lymph node removal from both of her groins she developed significant lower extremity edema, became less active and gained a lot of weight. The patient states that she has no disease in the last year worsening dyspnea on exertion, or doing house chores, walking couple flight of stairs, she denies chest pain, palpitations or syncope.  The patient was hospitalized on 04/19/14 with an acute respiratory failure due to COPD exacerbation and CHF exacerbation. She was discharged and is coming after 2 weeks. She was discharged on home O2.   She uses home O2 most nights and sometimes during the day. She sleeps much better. She underwent left knee replacement in May 0000000  Complicated byan infection. She was able to start walking and lost down to 314 lbs in 06/2016.   11/27/2016 - the patient is coming after 60 months, she was doing well until 3 weeks ago when she developed shortness of breath, productive cough, but no fever or chills, and was treated by her primary care physician for pneumonia with antibiotics. The patient states that she initially felt better with her productive cough changing to nonproductive and her antibiotics were discontinued. However in the last week she has developed worsening lower extremity edema, 3 pillow orthopnea, paroxysmal nocturnal dyspnea and worsening shortness of breath at rest. She is unable to sleep. She has gained 44 pounds since October.  CURRENT MEDS  OBJECTIVE  Vitals:   10/30/16 1005  BP: 132/80  Pulse: 95  Weight: (!) 358 lb (162.4 kg)  Height: 5\' 6"  (1.676 m)    Filed Weights   10/30/16 1005  Weight:  (!) 358 lb (162.4 kg)    PHYSICAL EXAM  General: Pleasant, Labored breathing on home oxygen. SPO2 90% Neuro: Alert and oriented X 3. Moves all extremities spontaneously. Psych: Normal affect. HEENT:  Normal  Neck: Supple without bruits or JVD. Lungs:  Resp regular and unlabored, bilateral basilar crackles Heart: RRR no s3, s4, or murmurs. Abdomen: Soft, non-tender, non-distended, BS + x 4.  Extremities: No clubbing, cyanosis, + 3 LE lymphedema . DP/PT/Radials 2+ and equal bilaterally.  Accessory Clinical Findings  Dg Chest Port 1 View  04/21/2014   CLINICAL DATA:  Edema.  EXAM: PORTABLE CHEST - 1 VIEW  COMPARISON:  April 20, 2014.  FINDINGS: Stable cardiomegaly and central pulmonary vascular congestion is noted. No pneumothorax or significant pleural effusion is noted. Bony thorax is intact.  IMPRESSION: Stable cardiomegaly and central pulmonary vascular congestion.   Electronically Signed   By: Sabino Dick M.D.   On: 04/21/2014 07:36   Dg Chest Port 1 View  04/20/2014   CLINICAL DATA:  Pulmonary edema  EXAM: PORTABLE CHEST - 1 VIEW  COMPARISON:  04/17/2014  FINDINGS: No change in moderate cardiomegaly. There is upper mediastinal widening which is likely from hypoaeration and portable technique.  Pulmonary venous congestion. No definite interstitial edema. No effusion or pneumothorax. No asymmetric opacity.  IMPRESSION: Cardiomegaly and pulmonary venous congestion.   Electronically Signed   By: Jorje Guild M.D.   On: 04/20/2014 07:02   EKG performed today 06/21/2016 showed normal sinus rhythm  and it's normal EKG unchanged from prior.    ASSESSMENT AND PLAN  61 year old female with h/o vulvar squamous cell cancer with known diastolic CHF, morbid obesity-ventilation syndrome, on home oxygen therapy but presented with acute on chronic diastolic CHF and acute on chronic respiratory failure with hypoxia.  1. Acute on Chronic diastolic congestive heart failure - the patient has gained  44 pounds since October, she is hypoxic, we will admit to the hospital and starts Lasix 80 mg IV twice a day and add metolazone 2.5 mg daily.  2. Acute on chronic respiratory failure with hypercapnia and hypoxia, SpO2 today 90% on 4 L oxygen. - we discontinued Carvedilol in the past as she has component of bronchospam   3. Hypertension - controlled.   4. Obesity - hypoventilation syndrome, lost weight the lats year.  5. Chronic lymphedema - improved after losing weight. Now worsening.   We will admit to telemetry, check BNP, CMP, TSH, CBC today. Check CXR.   Signed, Ena Dawley MD, [facility] 10/30/2016

## 2016-10-31 ENCOUNTER — Inpatient Hospital Stay (HOSPITAL_COMMUNITY): Payer: Medicare HMO

## 2016-10-31 ENCOUNTER — Encounter (HOSPITAL_COMMUNITY): Payer: Self-pay

## 2016-10-31 DIAGNOSIS — I509 Heart failure, unspecified: Secondary | ICD-10-CM

## 2016-10-31 LAB — BASIC METABOLIC PANEL
ANION GAP: 8 (ref 5–15)
BUN: 45 mg/dL — ABNORMAL HIGH (ref 6–20)
CHLORIDE: 89 mmol/L — AB (ref 101–111)
CO2: 36 mmol/L — AB (ref 22–32)
CREATININE: 1.27 mg/dL — AB (ref 0.44–1.00)
Calcium: 9.2 mg/dL (ref 8.9–10.3)
GFR calc non Af Amer: 45 mL/min — ABNORMAL LOW (ref >60)
GFR, EST AFRICAN AMERICAN: 52 mL/min — AB (ref >60)
Glucose, Bld: 147 mg/dL — ABNORMAL HIGH (ref 65–99)
POTASSIUM: 4.7 mmol/L (ref 3.5–5.1)
SODIUM: 133 mmol/L — AB (ref 135–145)

## 2016-10-31 LAB — GLUCOSE, CAPILLARY
Glucose-Capillary: 129 mg/dL — ABNORMAL HIGH (ref 65–99)
Glucose-Capillary: 109 mg/dL — ABNORMAL HIGH (ref 65–99)
Glucose-Capillary: 134 mg/dL — ABNORMAL HIGH (ref 65–99)
Glucose-Capillary: 126 mg/dL — ABNORMAL HIGH (ref 65–99)

## 2016-10-31 LAB — ECHOCARDIOGRAM COMPLETE
Height: 66 in
Weight: 5491.2 oz

## 2016-10-31 LAB — HIV ANTIBODY (ROUTINE TESTING W REFLEX): HIV Screen 4th Generation wRfx: NONREACTIVE

## 2016-10-31 MED ORDER — ALBUTEROL SULFATE (2.5 MG/3ML) 0.083% IN NEBU
2.5000 mg | INHALATION_SOLUTION | Freq: Two times a day (BID) | RESPIRATORY_TRACT | Status: DC
  Administered 2016-10-31 – 2016-11-03 (×7): 2.5 mg via RESPIRATORY_TRACT
  Filled 2016-10-31 (×7): qty 3

## 2016-10-31 MED ORDER — ALBUTEROL SULFATE (2.5 MG/3ML) 0.083% IN NEBU: 2.5000 mg | INHALATION_SOLUTION | RESPIRATORY_TRACT | Status: DC | PRN

## 2016-10-31 NOTE — Progress Notes (Signed)
  Echocardiogram 2D Echocardiogram has been performed.  Jennette Dubin 10/31/2016, 9:05 AM

## 2016-10-31 NOTE — Progress Notes (Signed)
Progress Note  Patient Name: [name] [name] Date of Encounter: 10/31/2016  Primary Cardiologist: Dr. Meda Coffee  Subjective   No SOB.  Legs feel better  Inpatient Medications    Scheduled Meds: . albuterol  2.5 mg Inhalation BID  . allopurinol  300 mg Oral Daily  . amitriptyline  10 mg Oral QHS  . amLODipine  5 mg Oral Daily  . furosemide  80 mg Intravenous BID  . heparin  5,000 Units Subcutaneous Q8H  . metFORMIN  500 mg Oral BID WC  . metolazone  2.5 mg Oral Daily  . mometasone-formoterol  2 puff Inhalation BID  . oxybutynin  10 mg Oral Daily  . pantoprazole  40 mg Oral Daily  . pravastatin  20 mg Oral Daily  . sodium chloride flush  3 mL Intravenous Q12H   Continuous Infusions:  PRN Meds: sodium chloride, acetaminophen, albuterol, ALPRAZolam, ondansetron (ZOFRAN) IV, sodium chloride flush   Vital Signs    Vitals:   10/30/16 2041 10/30/16 2048 10/30/16 2354 10/31/16 0555  BP:  (!) 134/59 (!) 139/53 134/63  Pulse:  93 99 99  Resp:  20 20 (!) 24  Temp:  98.6 F (37 C) 98.3 F (36.8 C) 98.2 F (36.8 C)  TempSrc:  Oral Oral Oral  SpO2: 94% 94% 95% 90%  Weight:    (!) 343 lb 3.2 oz (155.7 kg)  Height:        Intake/Output Summary (Last 24 hours) at 10/31/16 1012 Last data filed at 10/31/16 1003  Gross per 24 hour  Intake             2015 ml  Output             5200 ml  Net            -3185 ml   Filed Weights   10/30/16 1151 10/31/16 0555  Weight: (!) 347 lb 14.4 oz (157.8 kg) (!) 343 lb 3.2 oz (155.7 kg)    Telemetry    NSR, mild ectopy - Personally Reviewed  ECG    NA - Personally Reviewed  Physical Exam   GEN:  No acute distress.   Neck:  No JVD Cardiac: RRR, no  murmurs, rubs, or gallops.  Respiratory:  Clear to auscultation bilaterally. GI: Soft, nontender, non-distended  MS:  Severe bilateral leg edema; No deformity. Neuro:   Nonfocal  Psych: Normal affect   Labs    Chemistry Recent Labs Lab 10/30/16 0910 10/30/16 1241  10/31/16 0430  NA 136 136 133*  K 5.2 5.0 4.7  CL 90* 92* 89*  CO2 30* 36* 36*  GLUCOSE 92 92 147*  BUN 48* 47* 45*  CREATININE 1.50* 1.47* 1.27*  CALCIUM 9.3 9.6 9.2  PROT  --  6.8  --   ALBUMIN  --  3.3*  --   AST  --  13*  --   ALT  --  19  --   ALKPHOS  --  77  --   BILITOT  --  0.3  --   GFRNONAA 38* 38* 45*  GFRAA 43* 44* 52*  ANIONGAP  --  8 8     Hematology Recent Labs Lab 10/30/16 1241  WBC 5.5  RBC 4.27  HGB 10.4*  HCT 36.3  MCV 85.0  MCH 24.4*  MCHC 28.7*  RDW 16.2*  PLT 282    Cardiac EnzymesNo results for input(s): TROPONINI in the last 168 hours. No results for input(s): TROPIPOC in the last  168 hours.   BNP Recent Labs Lab 10/30/16 1241  BNP 163.9*     DDimer No results for input(s): DDIMER in the last 168 hours.   Radiology    Dg Chest 2 View  Result Date: 10/31/2016 CLINICAL DATA:  Follow-up CHF. EXAM: CHEST  2 VIEW COMPARISON:  04/21/2014 FINDINGS: There is cardiomegaly with vascular congestion and mild interstitial prominence, likely mild interstitial edema. No effusions or acute bony abnormality. IMPRESSION: Mild interstitial edema/ CHF. Electronically Signed   By: Rolm Baptise M.D.   On: 10/31/2016 08:17    Cardiac Studies   ECHO:  Pending.   Patient Profile     61 y.o. female with h/o HTN, squamous cell cancer of the vulva, s/p resection, currently in remission. The patient is coming complaining of dyspnea on exertion. She was admitted from the office with dyspnea.  She has gained 44 lbs since October.   Assessment & Plan    ACUTE ON CHRONIC DIASTOLIC HF:  Weight down 4 lbs.  Down 3 liters in the first 24 hours.  Creat is stable.   Continue current IV diuresis.   HTN:  BP well controlled  OBESITY:  Educated.   We discussed diet  CHRONIC LYMPHEDEMA:   I think that most of her edema is not at this point lymphedema.  Continue plan as above.   Signed, Minus Breeding, MD  10/31/2016, 10:12 AM

## 2016-11-01 LAB — BASIC METABOLIC PANEL
Anion gap: 6 (ref 5–15)
BUN: 39 mg/dL — AB (ref 6–20)
CHLORIDE: 89 mmol/L — AB (ref 101–111)
CO2: 39 mmol/L — ABNORMAL HIGH (ref 22–32)
Calcium: 9.5 mg/dL (ref 8.9–10.3)
Creatinine, Ser: 1.33 mg/dL — ABNORMAL HIGH (ref 0.44–1.00)
GFR, EST AFRICAN AMERICAN: 49 mL/min — AB (ref >60)
GFR, EST NON AFRICAN AMERICAN: 42 mL/min — AB (ref >60)
Glucose, Bld: 132 mg/dL — ABNORMAL HIGH (ref 65–99)
POTASSIUM: 4.6 mmol/L (ref 3.5–5.1)
SODIUM: 134 mmol/L — AB (ref 135–145)

## 2016-11-01 LAB — GLUCOSE, CAPILLARY
Glucose-Capillary: 104 mg/dL — ABNORMAL HIGH (ref 65–99)
Glucose-Capillary: 118 mg/dL — ABNORMAL HIGH (ref 65–99)
Glucose-Capillary: 144 mg/dL — ABNORMAL HIGH (ref 65–99)

## 2016-11-01 NOTE — Progress Notes (Signed)
Progress Note  Patient Name: [name] [name] Date of Encounter: 11/01/2016  Primary Cardiologist: Dr. Meda Coffee  Subjective   No SOB.  Tired from urinating so much.  No acute complaints.   Inpatient Medications    Scheduled Meds: . albuterol  2.5 mg Inhalation BID  . allopurinol  300 mg Oral Daily  . amitriptyline  10 mg Oral QHS  . amLODipine  5 mg Oral Daily  . furosemide  80 mg Intravenous BID  . heparin  5,000 Units Subcutaneous Q8H  . metFORMIN  500 mg Oral BID WC  . metolazone  2.5 mg Oral Daily  . mometasone-formoterol  2 puff Inhalation BID  . oxybutynin  10 mg Oral Daily  . pantoprazole  40 mg Oral Daily  . pravastatin  20 mg Oral Daily  . sodium chloride flush  3 mL Intravenous Q12H   Continuous Infusions:  PRN Meds: sodium chloride, acetaminophen, albuterol, ALPRAZolam, ondansetron (ZOFRAN) IV, sodium chloride flush   Vital Signs    Vitals:   10/31/16 2046 10/31/16 2055 11/01/16 0528 11/01/16 0730  BP: (!) 120/49  139/75 (!) 103/53  Pulse: 94 98 95 93  Resp: 18 18 18 20   Temp: 97.9 F (36.6 C)  97.7 F (36.5 C) 97.8 F (36.6 C)  TempSrc: Oral  Oral Oral  SpO2: 94% 93% 94% 97%  Weight:   (!) 335 lb 11.2 oz (152.3 kg)   Height:        Intake/Output Summary (Last 24 hours) at 11/01/16 1059 Last data filed at 11/01/16 1000  Gross per 24 hour  Intake              700 ml  Output             7150 ml  Net            -6450 ml   Filed Weights   10/30/16 1151 10/31/16 0555 11/01/16 0528  Weight: (!) 347 lb 14.4 oz (157.8 kg) (!) 343 lb 3.2 oz (155.7 kg) (!) 335 lb 11.2 oz (152.3 kg)     ECG    NA - Personally Reviewed  Physical Exam   GEN:  No acute distress.   Neck:  No JVD Cardiac: RRR, no  murmurs, rubs, or gallops.  Respiratory:  Clear to auscultation bilaterally. GI: Soft, nontender, non-distended  MS:  Severe bilateral leg edema improved; No deformity. Neuro:   Nonfocal  Psych: Normal affect   Labs    Chemistry  Recent Labs Lab  10/30/16 1241 10/31/16 0430 11/01/16 0221  NA 136 133* 134*  K 5.0 4.7 4.6  CL 92* 89* 89*  CO2 36* 36* 39*  GLUCOSE 92 147* 132*  BUN 47* 45* 39*  CREATININE 1.47* 1.27* 1.33*  CALCIUM 9.6 9.2 9.5  PROT 6.8  --   --   ALBUMIN 3.3*  --   --   AST 13*  --   --   ALT 19  --   --   ALKPHOS 77  --   --   BILITOT 0.3  --   --   GFRNONAA 38* 45* 42*  GFRAA 44* 52* 49*  ANIONGAP 8 8 6      Hematology  Recent Labs Lab 10/30/16 1241  WBC 5.5  RBC 4.27  HGB 10.4*  HCT 36.3  MCV 85.0  MCH 24.4*  MCHC 28.7*  RDW 16.2*  PLT 282    Cardiac EnzymesNo results for input(s): TROPONINI in the last 168 hours. No  results for input(s): TROPIPOC in the last 168 hours.   BNP  Recent Labs Lab 10/30/16 1241  BNP 163.9*     DDimer No results for input(s): DDIMER in the last 168 hours.   Radiology    Dg Chest 2 View  Result Date: 10/31/2016 CLINICAL DATA:  Follow-up CHF. EXAM: CHEST  2 VIEW COMPARISON:  04/21/2014 FINDINGS: There is cardiomegaly with vascular congestion and mild interstitial prominence, likely mild interstitial edema. No effusions or acute bony abnormality. IMPRESSION: Mild interstitial edema/ CHF. Electronically Signed   By: Rolm Baptise M.D.   On: 10/31/2016 08:17    Cardiac Studies   ECHO:  - Left ventricle: The cavity size was moderately dilated. Wall   thickness was increased in a pattern of mild LVH. Systolic   function was normal. The estimated ejection fraction was in the   range of 60% to 65%. Wall motion was normal; there were no   regional wall motion abnormalities. Doppler parameters are   consistent with pseudonormal left ventricular relaxation (grade 2   diastolic dysfunction). The E/e&' ratio is >15, suggesting   elevated LV filling pressure. - Aortic valve: Trileaflet. Sclerosis without stenosis. There was   no significant regurgitation. - Mitral valve: Mildly thickened leaflets . There was trivial   regurgitation. - Left atrium: The  atrium was normal in size. - Tricuspid valve: There was moderate regurgitation. - Pulmonary arteries: PA peak pressure: 53 mm Hg (S). - Inferior vena cava: The vessel was dilated. The respirophasic   diameter changes were blunted (< 50%), consistent with elevated   central venous pressure.  Patient Profile     61 y.o. female with h/o HTN, squamous cell cancer of the vulva, s/p resection, currently in remission. The patient is coming complaining of dyspnea on exertion. She was admitted from the office with dyspnea.  She has gained 44 lbs since October.   Assessment & Plan    ACUTE ON CHRONIC DIASTOLIC HF:  Echo results as above.  Weight down 12 lbs (if this is to be believed.)  Down almost 10 liters since admission. Creat is stable.   Continue current IV diuresis.   HTN:  BP well controlled  OBESITY:  Educated.   We discussed diet  CHRONIC LYMPHEDEMA:   I think that most of her edema is not at this point lymphedema.  Continue plan as above.   Signed, Minus Breeding, MD  11/01/2016, 10:59 AM

## 2016-11-02 LAB — BASIC METABOLIC PANEL
ANION GAP: 8 (ref 5–15)
BUN: 35 mg/dL — AB (ref 6–20)
CO2: 39 mmol/L — AB (ref 22–32)
Calcium: 9.2 mg/dL (ref 8.9–10.3)
Chloride: 87 mmol/L — ABNORMAL LOW (ref 101–111)
Creatinine, Ser: 1.26 mg/dL — ABNORMAL HIGH (ref 0.44–1.00)
GFR calc Af Amer: 53 mL/min — ABNORMAL LOW (ref >60)
GFR, EST NON AFRICAN AMERICAN: 45 mL/min — AB (ref >60)
GLUCOSE: 127 mg/dL — AB (ref 65–99)
Potassium: 4.4 mmol/L (ref 3.5–5.1)
Sodium: 134 mmol/L — ABNORMAL LOW (ref 135–145)

## 2016-11-02 LAB — GLUCOSE, CAPILLARY
Glucose-Capillary: 108 mg/dL — ABNORMAL HIGH (ref 65–99)
Glucose-Capillary: 103 mg/dL — ABNORMAL HIGH (ref 65–99)
Glucose-Capillary: 141 mg/dL — ABNORMAL HIGH (ref 65–99)
Glucose-Capillary: 137 mg/dL — ABNORMAL HIGH (ref 65–99)

## 2016-11-02 MED ORDER — FUROSEMIDE 10 MG/ML IJ SOLN
80.0000 mg | Freq: Three times a day (TID) | INTRAMUSCULAR | Status: DC
  Administered 2016-11-02 – 2016-11-03 (×4): 80 mg via INTRAVENOUS
  Filled 2016-11-02 (×3): qty 8

## 2016-11-02 NOTE — Progress Notes (Signed)
Progress Note  Patient Name: [name] [name] Date of Encounter: 11/02/2016  Primary Cardiologist: Dr. Meda Coffee  Subjective   She is breathing better.  No distress  Inpatient Medications    Scheduled Meds: . albuterol  2.5 mg Inhalation BID  . allopurinol  300 mg Oral Daily  . amitriptyline  10 mg Oral QHS  . amLODipine  5 mg Oral Daily  . furosemide  80 mg Intravenous BID  . heparin  5,000 Units Subcutaneous Q8H  . metFORMIN  500 mg Oral BID WC  . metolazone  2.5 mg Oral Daily  . mometasone-formoterol  2 puff Inhalation BID  . oxybutynin  10 mg Oral Daily  . pantoprazole  40 mg Oral Daily  . pravastatin  20 mg Oral Daily  . sodium chloride flush  3 mL Intravenous Q12H   Continuous Infusions:  PRN Meds: sodium chloride, acetaminophen, albuterol, ALPRAZolam, ondansetron (ZOFRAN) IV, sodium chloride flush   Vital Signs    Vitals:   11/01/16 1224 11/01/16 1941 11/01/16 2139 11/02/16 0552  BP: 131/66  (!) 152/73 138/77  Pulse: 91  100 98  Resp: 18  19 20   Temp: 97.7 F (36.5 C)  97.9 F (36.6 C) 98.3 F (36.8 C)  TempSrc: Oral  Oral Oral  SpO2: 96% 93% 93% 94%  Weight:    (!) 331 lb 11.2 oz (150.5 kg)  Height:        Intake/Output Summary (Last 24 hours) at 11/02/16 0819 Last data filed at 11/02/16 0600  Gross per 24 hour  Intake             1320 ml  Output             2950 ml  Net            -1630 ml   Filed Weights   10/31/16 0555 11/01/16 0528 11/02/16 0552  Weight: (!) 343 lb 3.2 oz (155.7 kg) (!) 335 lb 11.2 oz (152.3 kg) (!) 331 lb 11.2 oz (150.5 kg)     TELEMETRY    NSR - Personally Reviewed  Physical Exam   GEN:  No acute distress.   Neck:  No JVD Cardiac: RRR, no  murmurs, rubs, or gallops.  Respiratory:  Clear to auscultation bilaterally. GI: Soft, nontender, non-distended  MS:  Moderately severe bilateral leg edema improved; No deformity. Neuro:   Nonfocal  Psych: Normal affect   Labs    Chemistry  Recent Labs Lab 10/30/16 1241  10/31/16 0430 11/01/16 0221 11/02/16 0352  NA 136 133* 134* 134*  K 5.0 4.7 4.6 4.4  CL 92* 89* 89* 87*  CO2 36* 36* 39* 39*  GLUCOSE 92 147* 132* 127*  BUN 47* 45* 39* 35*  CREATININE 1.47* 1.27* 1.33* 1.26*  CALCIUM 9.6 9.2 9.5 9.2  PROT 6.8  --   --   --   ALBUMIN 3.3*  --   --   --   AST 13*  --   --   --   ALT 19  --   --   --   ALKPHOS 77  --   --   --   BILITOT 0.3  --   --   --   GFRNONAA 38* 45* 42* 45*  GFRAA 44* 52* 49* 53*  ANIONGAP 8 8 6 8      Hematology  Recent Labs Lab 10/30/16 1241  WBC 5.5  RBC 4.27  HGB 10.4*  HCT 36.3  MCV 85.0  MCH 24.4*  MCHC 28.7*  RDW 16.2*  PLT 282    Cardiac EnzymesNo results for input(s): TROPONINI in the last 168 hours. No results for input(s): TROPIPOC in the last 168 hours.   BNP  Recent Labs Lab 10/30/16 1241  BNP 163.9*     DDimer No results for input(s): DDIMER in the last 168 hours.   Radiology    No results found.  Cardiac Studies   ECHO:  - Left ventricle: The cavity size was moderately dilated. Wall   thickness was increased in a pattern of mild LVH. Systolic   function was normal. The estimated ejection fraction was in the   range of 60% to 65%. Wall motion was normal; there were no   regional wall motion abnormalities. Doppler parameters are   consistent with pseudonormal left ventricular relaxation (grade 2   diastolic dysfunction). The E/e&' ratio is >15, suggesting   elevated LV filling pressure. - Aortic valve: Trileaflet. Sclerosis without stenosis. There was   no significant regurgitation. - Mitral valve: Mildly thickened leaflets . There was trivial   regurgitation. - Left atrium: The atrium was normal in size. - Tricuspid valve: There was moderate regurgitation. - Pulmonary arteries: PA peak pressure: 53 mm Hg (S). - Inferior vena cava: The vessel was dilated. The respirophasic   diameter changes were blunted (< 50%), consistent with elevated   central venous  pressure.  Patient Profile     61 y.o. female with h/o HTN, squamous cell cancer of the vulva, s/p resection, currently in remission. The patient is coming complaining of dyspnea on exertion. She was admitted from the office with dyspnea.  She has gained 44 lbs since October.   Assessment & Plan    ACUTE ON CHRONIC DIASTOLIC HF:  Echo results as above.  Weight down 16 lbs (if this is to be believed.)  Down almost 10,305 liters since admission. Creat is stable.   Continue current IV diuresis. I will give an increased dose of Lasix today.    HTN:  BP well controlled  OBESITY:  Educated.   We discussed diet and today we discussed bariatric surgery.   CHRONIC LYMPHEDEMA:  Continued lymphedema.   Signed, Minus Breeding, MD  11/02/2016, 8:19 AM

## 2016-11-03 ENCOUNTER — Telehealth: Payer: Self-pay | Admitting: Physician Assistant

## 2016-11-03 LAB — BASIC METABOLIC PANEL
ANION GAP: 10 (ref 5–15)
BUN: 35 mg/dL — ABNORMAL HIGH (ref 6–20)
CHLORIDE: 81 mmol/L — AB (ref 101–111)
CO2: 41 mmol/L — ABNORMAL HIGH (ref 22–32)
Calcium: 9.5 mg/dL (ref 8.9–10.3)
Creatinine, Ser: 1.32 mg/dL — ABNORMAL HIGH (ref 0.44–1.00)
GFR calc Af Amer: 50 mL/min — ABNORMAL LOW (ref >60)
GFR calc non Af Amer: 43 mL/min — ABNORMAL LOW (ref >60)
Glucose, Bld: 120 mg/dL — ABNORMAL HIGH (ref 65–99)
Potassium: 4.2 mmol/L (ref 3.5–5.1)
SODIUM: 132 mmol/L — AB (ref 135–145)

## 2016-11-03 LAB — GLUCOSE, CAPILLARY
Glucose-Capillary: 112 mg/dL — ABNORMAL HIGH (ref 65–99)
Glucose-Capillary: 158 mg/dL — ABNORMAL HIGH (ref 65–99)

## 2016-11-03 MED ORDER — FUROSEMIDE 40 MG PO TABS: ORAL_TABLET | ORAL | 6 refills | Status: DC

## 2016-11-03 NOTE — Progress Notes (Signed)
Progress Note  Patient Name: [name] [name] Date of Encounter: 11/03/2016  Primary Cardiologist: Dr. Meda Coffee  Subjective   She is breathing better.  No distress.  Her legs are less sore.  She is ready to go home.   Inpatient Medications    Scheduled Meds: . albuterol  2.5 mg Inhalation BID  . allopurinol  300 mg Oral Daily  . amitriptyline  10 mg Oral QHS  . amLODipine  5 mg Oral Daily  . furosemide  80 mg Intravenous TID  . heparin  5,000 Units Subcutaneous Q8H  . metFORMIN  500 mg Oral BID WC  . metolazone  2.5 mg Oral Daily  . mometasone-formoterol  2 puff Inhalation BID  . oxybutynin  10 mg Oral Daily  . pantoprazole  40 mg Oral Daily  . pravastatin  20 mg Oral Daily  . sodium chloride flush  3 mL Intravenous Q12H   Continuous Infusions:  PRN Meds: sodium chloride, acetaminophen, albuterol, ALPRAZolam, ondansetron (ZOFRAN) IV, sodium chloride flush   Vital Signs    Vitals:   11/02/16 1113 11/02/16 1959 11/03/16 0455 11/03/16 0837  BP: 132/69 (!) 123/54 133/63   Pulse: 81 60 100   Resp: 18 18 18    Temp: 97 F (36.1 C) 98.3 F (36.8 C) 98 F (36.7 C)   TempSrc: Oral Oral Oral   SpO2: 97% 96% 98% 93%  Weight:   (!) 327 lb 1.6 oz (148.4 kg)   Height:        Intake/Output Summary (Last 24 hours) at 11/03/16 0840 Last data filed at 11/03/16 0626  Gross per 24 hour  Intake              720 ml  Output             4500 ml  Net            -3780 ml   Filed Weights   11/01/16 0528 11/02/16 0552 11/03/16 0455  Weight: (!) 335 lb 11.2 oz (152.3 kg) (!) 331 lb 11.2 oz (150.5 kg) (!) 327 lb 1.6 oz (148.4 kg)     TELEMETRY    NSR - Personally Reviewed  Physical Exam   GEN:  No acute distress.   Neck:  No JVD Cardiac: RRR, no  murmurs, rubs, or gallops.  Respiratory:  Clear to auscultation bilaterally. GI: Soft, nontender, non-distended  MS:  Moderate bilateral leg edema improved; No deformity. Neuro:   Nonfocal  Psych: Normal affect   Labs      Chemistry  Recent Labs Lab 10/30/16 1241  11/01/16 0221 11/02/16 0352 11/03/16 0323  NA 136  < > 134* 134* 132*  K 5.0  < > 4.6 4.4 4.2  CL 92*  < > 89* 87* 81*  CO2 36*  < > 39* 39* 41*  GLUCOSE 92  < > 132* 127* 120*  BUN 47*  < > 39* 35* 35*  CREATININE 1.47*  < > 1.33* 1.26* 1.32*  CALCIUM 9.6  < > 9.5 9.2 9.5  PROT 6.8  --   --   --   --   ALBUMIN 3.3*  --   --   --   --   AST 13*  --   --   --   --   ALT 19  --   --   --   --   ALKPHOS 77  --   --   --   --   BILITOT 0.3  --   --   --   --  GFRNONAA 38*  < > 42* 45* 43*  GFRAA 44*  < > 49* 53* 50*  ANIONGAP 8  < > 6 8 10   < > = values in this interval not displayed.   Hematology  Recent Labs Lab 10/30/16 1241  WBC 5.5  RBC 4.27  HGB 10.4*  HCT 36.3  MCV 85.0  MCH 24.4*  MCHC 28.7*  RDW 16.2*  PLT 282    Cardiac EnzymesNo results for input(s): TROPONINI in the last 168 hours. No results for input(s): TROPIPOC in the last 168 hours.   BNP  Recent Labs Lab 10/30/16 1241  BNP 163.9*     DDimer No results for input(s): DDIMER in the last 168 hours.   Radiology    No results found.  Cardiac Studies   ECHO:  - Left ventricle: The cavity size was moderately dilated. Wall   thickness was increased in a pattern of mild LVH. Systolic   function was normal. The estimated ejection fraction was in the   range of 60% to 65%. Wall motion was normal; there were no   regional wall motion abnormalities. Doppler parameters are   consistent with pseudonormal left ventricular relaxation (grade 2   diastolic dysfunction). The E/e&' ratio is >15, suggesting   elevated LV filling pressure. - Aortic valve: Trileaflet. Sclerosis without stenosis. There was   no significant regurgitation. - Mitral valve: Mildly thickened leaflets . There was trivial   regurgitation. - Left atrium: The atrium was normal in size. - Tricuspid valve: There was moderate regurgitation. - Pulmonary arteries: PA peak pressure: 53 mm  Hg (S). - Inferior vena cava: The vessel was dilated. The respirophasic   diameter changes were blunted (< 50%), consistent with elevated   central venous pressure.  Patient Profile     61 y.o. female with h/o HTN, squamous cell cancer of the vulva, s/p resection, currently in remission. The patient is coming complaining of dyspnea on exertion. She was admitted from the office with dyspnea.  She has gained 44 lbs since October.   Assessment & Plan    ACUTE ON CHRONIC DIASTOLIC HF:  Echo results as above.  Weight down 20 lbs  Down greater than 16  liters since admission. Creat is stable.   OK to discharge with PO diuresis and education. Should have a TOC visit next week with labs. Discharge today.    She should go home on Lasix 80 mg po am and 40 mg po six hours later.  She does not recall being on Zaroxolyn and should not be on this.   HTN:  BP well controlled  OBESITY:  Educated.   We discussed diet and today we discussed bariatric surgery.   CHRONIC LYMPHEDEMA:  Continued lymphedema.   Signed, Minus Breeding, MD  11/03/2016, 8:40 AM

## 2016-11-03 NOTE — Telephone Encounter (Signed)
New message      TCM appt on 11-09-16 with Estella Husk per [place].

## 2016-11-03 NOTE — Discharge Summary (Signed)
Discharge Summary    Patient ID: [name] [name],  MRN: [id number], DOB/AGE: 04/28/1956 61 y.o.  Admit date: 10/30/2016 Discharge date: 11/03/2016  Primary Care Provider: CORRINGTON,KIP A Primary Cardiologist: Dr. Meda Coffee  Discharge Diagnoses    Active Problems:   Acute on chronic diastolic CHF (congestive heart failure) (HCC)   Hypertension  Obesity  Allergies Allergies  Allergen Reactions  . Penicillins Nausea And Vomiting and Rash    Other reaction(s): GI Upset (intolerance) OK with amoxicillin  . Sulfa Antibiotics Hives and Rash    Exact drug unknown    Diagnostic Studies/Procedures    Echo 10/31/16 Study Conclusions  - Left ventricle: The cavity size was moderately dilated. Wall   thickness was increased in a pattern of mild LVH. Systolic   function was normal. The estimated ejection fraction was in the   range of 60% to 65%. Wall motion was normal; there were no   regional wall motion abnormalities. Doppler parameters are   consistent with pseudonormal left ventricular relaxation (grade 2   diastolic dysfunction). The E/e&' ratio is >15, suggesting   elevated LV filling pressure. - Aortic valve: Trileaflet. Sclerosis without stenosis. There was   no significant regurgitation. - Mitral valve: Mildly thickened leaflets . There was trivial   regurgitation. - Left atrium: The atrium was normal in size. - Tricuspid valve: There was moderate regurgitation. - Pulmonary arteries: PA peak pressure: 53 mm Hg (S). - Inferior vena cava: The vessel was dilated. The respirophasic   diameter changes were blunted (< 50%), consistent with elevated   central venous pressure.  Impressions:  - Compared to a prior study in 2015, the LVEF is stable. There is   now Grade 2 diastolic dysfunction with elevated left and right   heart pressures, moderate TR and RVSP of 53 mmHg and a dilated   IVC.    History of Present Illness     61year old patient with h/o HTN, squamous  cell cancer of the vulva, s/p resection, currently in remission presented for evaluation of dyspnea on exertion in clinic and directly admitted 10/30/16 for acute on chronic diastolic CHF with weight gain of approximately 44 since october 2017.  The patient states that ever since she had her surgery and lymph node removal from both of her groins she developed significant lower extremity edema, became less active and gained a lot of weight. The patient states that she has no disease in the last year worsening dyspnea on exertion, or doing house chores, walking couple flight of stairs, she denies chest pain, palpitations or syncope.   The patient was hospitalized on 04/19/14 with an acute respiratory failure due to COPD exacerbation and CHF exacerbation. She was discharged and is coming after 2 weeks. She was discharged on home O2.  Sheuses home O2 most nights and sometimes during the day. She sleeps much better.  She underwent left knee replacement in May 0000000 Complicated byan infection. She was able to start walking and lost down to 314 lbs in 06/2016.   she was doing well until 3 weeks ago when she developed shortness of breath, productive cough,but no fever or chills, and was treated by her primary care physician for pneumonia with antibiotics. The patient states that she initially felt better with her productive cough changing to nonproductive and her antibiotics were discontinued. However in the last week she has developed worsening lower extremity edema, 3 pillow orthopnea, paroxysmal nocturnal dyspnea and worsening shortness of breath at rest. She  is unable to sleep. She has gained 44 pounds since October.  BNP 163.9  Hospital Course     Consultants: None  1. Acute on Chronic diastolic congestive heart failure - - She was admit to the hospital and started Lasix 80 mg IV twice a day and add metolazone 2.5 mg daily. Echo showed normal EF with grade 2 DD with elevated left and right heart  pressure, moderate TR and RSVP of 28mm HG. She diuresed greater than 16 L. Weight down 20lbs (347-->327). Scr improved with diuresis from 1.47-->1.32 at discharge. Breathing improved. She is discharged on Lasix 80 mg po am and 40 mg po six hours later. She does not recall being on Zaroxolyn and so discontinued at discharge. Patient did not required supplemental K during admission.   2. Hypertension - well controlled on Norvasc. Discontinued coreg in past due to possible bronchospasm.   3. Obesity- hypoventilation syndrome, encouraged weight loss. Discussed bariatric surgery.   4. Chronic lymphedema- Stable  The patient has been seen by Dr. Percival Spanish today and deemed ready for discharge home. All follow-up appointments have been scheduled. Discharge medications are listed below.   Discharge Vitals Blood pressure 133/63, pulse 100, temperature 98 F (36.7 C), temperature source Oral, resp. rate 18, height 5\' 6"  (1.676 m), weight (!) 327 lb 1.6 oz (148.4 kg), SpO2 93 %.  Filed Weights   11/01/16 0528 11/02/16 0552 11/03/16 0455  Weight: (!) 335 lb 11.2 oz (152.3 kg) (!) 331 lb 11.2 oz (150.5 kg) (!) 327 lb 1.6 oz (148.4 kg)    Labs & Radiologic Studies     CBC No results for input(s): WBC, NEUTROABS, HGB, HCT, MCV, PLT in the last 72 hours. Basic Metabolic Panel  Recent Labs  11/02/16 0352 11/03/16 0323  NA 134* 132*  K 4.4 4.2  CL 87* 81*  CO2 39* 41*  GLUCOSE 127* 120*  BUN 35* 35*  CREATININE 1.26* 1.32*  CALCIUM 9.2 9.5   Liver Function Tests No results for input(s): AST, ALT, ALKPHOS, BILITOT, PROT, ALBUMIN in the last 72 hours. No results for input(s): LIPASE, AMYLASE in the last 72 hours. Cardiac Enzymes No results for input(s): CKTOTAL, CKMB, CKMBINDEX, TROPONINI in the last 72 hours. BNP Invalid input(s): POCBNP D-Dimer No results for input(s): DDIMER in the last 72 hours. Hemoglobin A1C No results for input(s): HGBA1C in the last 72 hours. Fasting Lipid  Panel No results for input(s): CHOL, HDL, LDLCALC, TRIG, CHOLHDL, LDLDIRECT in the last 72 hours. Thyroid Function Tests No results for input(s): TSH, T4TOTAL, T3FREE, THYROIDAB in the last 72 hours.  Invalid input(s): FREET3  Dg Chest 2 View  Result Date: 10/31/2016 CLINICAL DATA:  Follow-up CHF. EXAM: CHEST  2 VIEW COMPARISON:  04/21/2014 FINDINGS: There is cardiomegaly with vascular congestion and mild interstitial prominence, likely mild interstitial edema. No effusions or acute bony abnormality. IMPRESSION: Mild interstitial edema/ CHF. Electronically Signed   By: Rolm Baptise M.D.   On: 10/31/2016 08:17    Disposition   Pt is being discharged home today in good condition.  Follow-up Plans & Appointments    Follow-up Information    Ermalinda Barrios, PA-C. Go on 11/09/2016.   Specialty:  Cardiology Why:  @10 :15 for hospital follow ip Contact information: [address] [place] [id number] [phone]          Discharge Instructions    Diet - low sodium heart healthy    Complete by:  As directed    Discharge  instructions    Complete by:  As directed    *Weigh yourself on the same scale at same time of day and keep a log. *Report weight gain of > 3 lbs in 1 day or 5 lbs over the course of a week and/or symptoms of excess fluid (shortness of breath, difficulty lying flat, swelling, poor appetite, abdominal fullness/bloating, etc) to your doctor immediately. *Avoid foods that are high in sodium (processed, pre-packaged/canned goods, fast foods, etc). *Please attend all scheduled and reccommended follow up appointments   Increase activity slowly    Complete by:  As directed       Discharge Medications   Current Discharge Medication List    CONTINUE these medications which have CHANGED   Details  furosemide (LASIX) 40 MG tablet Take 80 mg (2 tablet)  by mouth in the AM and 40 mg (1tablet) po six hours later. Qty: 90 tablet, Refills: 6      CONTINUE  these medications which have NOT CHANGED   Details  albuterol (PROVENTIL HFA;VENTOLIN HFA) 108 (90 BASE) MCG/ACT inhaler INHALE 2 PUFFS INTO THE LUNGS EVERY 4 HOURS AS NEEDED FOR SHORTNESS OF BREATH    allopurinol (ZYLOPRIM) 300 MG tablet Take 300 mg by mouth daily.    amitriptyline (ELAVIL) 10 MG tablet Take 10 mg by mouth at bedtime.    amLODipine (NORVASC) 5 MG tablet Take 1 tablet (5 mg total) by mouth daily. Qty: 30 tablet, Refills: 6    aspirin 81 MG chewable tablet Chew 81 mg by mouth daily.    budesonide-formoterol (SYMBICORT) 160-4.5 MCG/ACT inhaler Inhale 2 puffs into the lungs 2 (two) times daily. Qty: 1 Inhaler, Refills: 2    metFORMIN (GLUCOPHAGE) 500 MG tablet Take 1 tablet (500 mg total) by mouth 2 (two) times daily with a meal. Qty: 60 tablet, Refills: 1    oxybutynin (DITROPAN-XL) 10 MG 24 hr tablet Take 1 tablet by mouth daily. Refills: 2    pantoprazole (PROTONIX) 40 MG tablet Take 40 mg by mouth daily.    pravastatin (PRAVACHOL) 20 MG tablet Take 20 mg by mouth daily.      STOP taking these medications     gabapentin (NEURONTIN) 100 MG capsule      ALPRAZolam (XANAX) 0.5 MG tablet           Outstanding Labs/Studies   BMET during follow up  Duration of Discharge Encounter   Greater than 30 minutes including physician time.  Signed, Bhagat,Bhavinkumar PA-C 11/03/2016, 10:26 AM  Patient seen and examined.  Plan as discussed in my rounding note for today and outlined above. Jeneen Rinks Ninnie Fein  11/03/2016  10:30 AM

## 2016-11-03 NOTE — Care Management Important Message (Signed)
Important Message  Patient Details  Name: [name] [name] MRN: [id number] Date of Birth: 1955-10-10   Medicare Important Message Given:  Yes    Orbie Pyo 11/03/2016, 2:55 PM

## 2016-11-03 NOTE — Care Management Note (Signed)
Case Management Note  Patient Details  Name: [name] [name] MRN: [id number] Date of Birth: 25-Jul-1956  Subjective/Objective:     Admitted with CHF               Action/Plan: Patient lives at home with spouse; Primary Cardiologist: Dr. Meda Coffee; has private insurance with [facility] Medicare with prescription drug coverage; DME - home oxygen; No needs identified at this time.  Expected Discharge Date:  11/03/16               Expected Discharge Plan:  Home/Self Care     Discharge planning Services  CM Consult   Status of Service:  In process, will continue to follow  Sherrilyn Rist [id number] 11/03/2016, 11:18 AM

## 2016-11-06 ENCOUNTER — Encounter: Payer: Self-pay | Admitting: Physician Assistant

## 2016-11-06 ENCOUNTER — Ambulatory Visit (INDEPENDENT_AMBULATORY_CARE_PROVIDER_SITE_OTHER): Payer: Medicare HMO | Admitting: Physician Assistant

## 2016-11-06 VITALS — BP 128/64 | HR 88 | Ht 66.0 in | Wt 324.0 lb

## 2016-11-06 DIAGNOSIS — I5032 Chronic diastolic (congestive) heart failure: Secondary | ICD-10-CM | POA: Diagnosis not present

## 2016-11-06 DIAGNOSIS — I110 Hypertensive heart disease with heart failure: Secondary | ICD-10-CM

## 2016-11-06 DIAGNOSIS — R609 Edema, unspecified: Secondary | ICD-10-CM | POA: Diagnosis not present

## 2016-11-06 LAB — BASIC METABOLIC PANEL
BUN/Creatinine Ratio: 30 — ABNORMAL HIGH (ref 12–28)
BUN: 39 mg/dL — ABNORMAL HIGH (ref 8–27)
CALCIUM: 9.6 mg/dL (ref 8.7–10.3)
CHLORIDE: 82 mmol/L — AB (ref 96–106)
CO2: 29 mmol/L (ref 18–29)
Creatinine, Ser: 1.31 mg/dL — ABNORMAL HIGH (ref 0.57–1.00)
GFR calc Af Amer: 51 mL/min/{1.73_m2} — ABNORMAL LOW (ref >59)
GFR, EST NON AFRICAN AMERICAN: 44 mL/min/{1.73_m2} — AB (ref >59)
Glucose: 115 mg/dL — ABNORMAL HIGH (ref 65–99)
POTASSIUM: 4.4 mmol/L (ref 3.5–5.2)
Sodium: 131 mmol/L — ABNORMAL LOW (ref 134–144)

## 2016-11-06 NOTE — Progress Notes (Signed)
Cardiology Office Note    Date:  11/06/2016   ID:  [name], DOB 12-May-1956, MRN [id number]  PCP:  Curly Rim, MD  Cardiologist: Dr. Meda Coffee  Chief Complaint  Patient presents with  . Follow-up    History of Present Illness:  [name] [name] is a 61 y.o. female  with h/o HTN, squamous cell cancer of the vulva, s/p resection, currently in remission presented for evaluation of dyspnea on exertion in clinic and directly admitted 10/30/16 for acute on chronic diastolic CHF with weight gain of approximately 44 since october 2017.   The patient states that ever since she had her surgery and lymph node removal from both of her groins she developed significant lower extremity edema, became less active and gained a lot of weight.   She diuresed with Lasix 80 mg IV BID and metolazone 2.5 mg daily Echo with normal EF grade 2 DD. Diuresed 16 L 20 lbs. Weight 347-327 lbs. Crt improved 1.32.   Patient comes in today feeling much better. Has lost 3 more lbs. Trying to watch her salt but difficult for her. On oxygen. Has chronic lymphedema. Has had her legs wrapped in the past but doesn't want to have to do that again. Has also had compression stockings in the past but has difficulty getting them on.   Past Medical History:  Diagnosis Date  . Bronchitis, mucopurulent recurrent ([place])   . Carpal tunnel syndrome    bilateral  . CHF (congestive heart failure) ([place])   . Diabetes mellitus without complication ([place])   . Diastolic heart failure ([place])    Echo 11/14 LVEF 55%. Ggrade 2 diastolic dysfunction  . DOE (dyspnea on exertion)   . Esophageal reflux   . Essential hypertension, benign   . Former smoker   . Gout   . Lymphedema   . Morbid obesity ([place])   . Osteoarthrosis, unspecified whether generalized or localized, unspecified site   . Other and unspecified hyperlipidemia   . Polyp of nasal cavity   . Vulva cancer ([place])    resected 2002    Past Surgical History:  Procedure  Laterality Date  . CESAREAN SECTION    . COLONOSCOPY W/ POLYPECTOMY     negative polyps  . LYMPHADENECTOMY Bilateral    inguinal   . RADICAL VULVECTOMY    . TOTAL KNEE ARTHROPLASTY Right 01/2014   [facility] (Dr Rito Ehrlich)    Current Medications: Outpatient Medications Prior to Visit  Medication Sig Dispense Refill  . albuterol (PROVENTIL HFA;VENTOLIN HFA) 108 (90 BASE) MCG/ACT inhaler INHALE 2 PUFFS INTO THE LUNGS EVERY 4 HOURS AS NEEDED FOR SHORTNESS OF BREATH    . allopurinol (ZYLOPRIM) 300 MG tablet Take 300 mg by mouth daily.    Marland Kitchen amitriptyline (ELAVIL) 10 MG tablet Take 10 mg by mouth at bedtime.    Marland Kitchen amLODipine (NORVASC) 5 MG tablet Take 1 tablet (5 mg total) by mouth daily. 30 tablet 6  . aspirin 81 MG chewable tablet Chew 81 mg by mouth daily.    . budesonide-formoterol (SYMBICORT) 160-4.5 MCG/ACT inhaler Inhale 2 puffs into the lungs 2 (two) times daily. 1 Inhaler 2  . furosemide (LASIX) 40 MG tablet Take 80 mg (2 tablet)  by mouth in the AM and 40 mg (1tablet) po six hours later. 90 tablet 6  . metFORMIN (GLUCOPHAGE) 500 MG tablet Take 1 tablet (500 mg total) by mouth 2 (two) times daily with a meal. 60 tablet 1  . oxybutynin (DITROPAN-XL)  10 MG 24 hr tablet Take 1 tablet by mouth daily.  2  . pantoprazole (PROTONIX) 40 MG tablet Take 40 mg by mouth daily.    . pravastatin (PRAVACHOL) 20 MG tablet Take 20 mg by mouth daily.     No facility-administered medications prior to visit.      Allergies:   Penicillins and Sulfa antibiotics   Social History   Social History  . Marital status: Married    Spouse name: N/A  . Number of children: 1  . Years of education: N/A   Occupational History  . retired    Social History Main Topics  . Smoking status: Former Smoker    Packs/day: 1.50    Years: 25.00    Types: Cigarettes    Quit date: 09/05/1999  . Smokeless tobacco: Never Used  . Alcohol use Yes  . Drug use: No  . Sexual activity: Not Asked   Other Topics  Concern  . None   Social History Narrative   Disabled due to obesity and osteoarthritis     Family History:  The patient's   family history includes COPD in her brother; Congestive Heart Failure in her mother; Diabetes type II in her father; Healthy in her brother, brother, and sister; Heart attack in her brother; Heart disease in her brother; Hypertension in her sister; Kidney failure in her mother; Stroke in her father.   ROS:   Please see the history of present illness.    Review of Systems  Constitution: Positive for weight gain.  HENT: Negative.   Eyes: Negative.   Cardiovascular: Positive for dyspnea on exertion and leg swelling.  Respiratory: Positive for shortness of breath.   Hematologic/Lymphatic: Negative.   Musculoskeletal: Negative.  Negative for joint pain.  Gastrointestinal: Negative.   Genitourinary: Negative.   Neurological: Negative.    All other systems reviewed and are negative.   PHYSICAL EXAM:   VS:  BP 128/64   Pulse 88   Ht 5\' 6"  (1.676 m)   Wt (!) 324 lb (147 kg)   BMI 52.29 kg/m   Physical Exam  GEN: Obese, in no acute distress on oxygen Neck: no JVD, carotid bruits, or masses Cardiac:RRR; no murmurs, rubs, or gallops  Respiratory:  clear to auscultation bilaterally, normal work of breathing GI: soft, nontender, nondistended, + BS Ext: Chronic lymphedema 2-3+ Psych: euthymic mood, full affect  Wt Readings from Last 3 Encounters:  11/06/16 (!) 324 lb (147 kg)  11/03/16 (!) 327 lb 1.6 oz (148.4 kg)  10/30/16 (!) 358 lb (162.4 kg)      Studies/Labs Reviewed:   EKG:  EKG is not ordered today.    Recent Labs: 10/30/2016: ALT 19; B Natriuretic Peptide 163.9; Hemoglobin 10.4; Magnesium 1.9; Platelets 282; TSH 1.507 11/03/2016: BUN 35; Creatinine, Ser 1.32; Potassium 4.2; Sodium 132   Lipid Panel    Component Value Date/Time   CHOL 192 06/21/2016 0959   TRIG 128 06/21/2016 0959   HDL 47 06/21/2016 0959   CHOLHDL 4.1 06/21/2016 0959   VLDL  26 06/21/2016 0959   LDLCALC 119 06/21/2016 0959    Additional studies/ records that were reviewed today include:   Echo 10/31/16 Study Conclusions   - Left ventricle: The cavity size was moderately dilated. Wall   thickness was increased in a pattern of mild LVH. Systolic   function was normal. The estimated ejection fraction was in the   range of 60% to 65%. Wall motion was normal; there were no  regional wall motion abnormalities. Doppler parameters are   consistent with pseudonormal left ventricular relaxation (grade 2   diastolic dysfunction). The E/e&' ratio is >15, suggesting   elevated LV filling pressure. - Aortic valve: Trileaflet. Sclerosis without stenosis. There was   no significant regurgitation. - Mitral valve: Mildly thickened leaflets . There was trivial   regurgitation. - Left atrium: The atrium was normal in size. - Tricuspid valve: There was moderate regurgitation. - Pulmonary arteries: PA peak pressure: 53 mm Hg (S). - Inferior vena cava: The vessel was dilated. The respirophasic   diameter changes were blunted (< 50%), consistent with elevated   central venous pressure.   Impressions:   - Compared to a prior study in 2015, the LVEF is stable. There is   now Grade 2 diastolic dysfunction with elevated left and right   heart pressures, moderate TR and RVSP of 53 mmHg and a dilated   IVC.      ASSESSMENT:    1. Chronic diastolic heart failure ([place])   2. Hypertensive heart disease with CHF (congestive heart failure) ([facility])   3. Edema, unspecified type   4. Morbid obesity-BMI 52      PLAN:  In order of problems listed above:  Chronic diastolic heart failure with 23 pound weight loss overall. Continue current dose Lasix. Check labs today. Patient is not on potassium. Follow-up with Dr. Meda Coffee in 4 weeks. 2 g sodium diet reiterated.  Hypertensive heart disease with heart failure well controlled  Lymphedema chronic have given a prescription for  compression hose  Morbid obesity BMI is actually 52 today. Loss recommended.    Medication Adjustments/Labs and Tests Ordered: Current medicines are reviewed at length with the patient today.  Concerns regarding medicines are outlined above.  Medication changes, Labs and Tests ordered today are listed in the Patient Instructions below. Patient Instructions  Medication Instructions:  Your physician recommends that you continue on your current medications as directed. Please refer to the Current Medication list given to you today.  Labwork: TODAY:  BMET   Testing/Procedures: None ordered  Follow-Up: Your physician recommends that you schedule a follow-up appointment in: [place]   Any Other Special Instructions Will Be Listed Below (If Applicable). Daily Weight Record It is important to weigh yourself daily. Keep this daily weight chart near your scale. Weigh yourself each morning at the same time. Weigh yourself without shoes, and wear the same amount of clothing each day. Compare today's weight to yesterday's weight. Bring this form with you to your follow-up appointments. Call your health care provider if you have concerns about your weight, including rapid weight gain or rapid weight loss.  Date: ________ Weight: ____________________ Date: ________ Weight: ____________________ Date: ________ Weight: ____________________ Date: ________ Weight: ____________________ Date: ________ Weight: ____________________ Date: ________ Weight: ____________________ Date: ________ Weight: ____________________ Date: ________ Weight: ____________________ Date: ________ Weight: ____________________ Date: ________ Weight: ____________________ Date: ________ Weight: ____________________ Date: ________ Weight: ____________________ Date: ________ Weight: ____________________ Date: ________ Weight: ____________________ Date: ________ Weight: ____________________ Date: ________ Weight:  ____________________ Date: ________ Weight: ____________________ Date: ________ Weight: ____________________ Date: ________ Weight: ____________________ Date: ________ Weight: ____________________ Date: ________ Weight: ____________________ Date: ________ Weight: ____________________ Date: ________ Weight: ____________________ Date: ________ Weight: ____________________ Date: ________ Weight: ____________________ Date: ________ Weight: ____________________ Date: ________ Weight: ____________________ Date: ________ Weight: ____________________ Date: ________ Weight: ____________________ Date: ________ Weight: ____________________ Date: ________ Weight: ____________________ Date: ________ Weight: ____________________ Date: ________ Weight: ____________________ Date: ________ Weight: ____________________ Date: ________ Weight:  ____________________ Date: ________ Weight: ____________________ Date: ________ Weight: ____________________ Date: ________ Weight: ____________________ Date: ________ Weight: ____________________ Date: ________ Weight: ____________________ Date: ________ Weight: ____________________ Date: ________ Weight: ____________________ Date: ________ Weight: ____________________ Date: ________ Weight: ____________________ Date: ________ Weight: ____________________ Date: ________ Weight: ____________________ Date: ________ Weight: ____________________ Date: ________ Weight: ____________________ Date: ________ Weight: ____________________ Date: ________ Weight: ____________________ This information is not intended to replace advice given to you by your health care provider. Make sure you discuss any questions you have with your health care provider. Document Released: 11/02/2006 Document Revised: 08/04/2016 Document Reviewed: 03/20/2014 Elsevier Interactive Patient Education  2017 [employer].   Low-Sodium Eating Plan Sodium, which is an element that makes up salt, helps you  maintain a healthy balance of fluids in your body. Too much sodium can increase your blood pressure and cause fluid and waste to be held in your body. Your health care provider or dietitian may recommend following this plan if you have high blood pressure (hypertension), kidney disease, liver disease, or heart failure. Eating less sodium can help lower your blood pressure, reduce swelling, and protect your heart, liver, and kidneys. What are tips for following this plan? General guidelines   Most people on this plan should limit their sodium intake to 1,500-2,000 mg (milligrams) of sodium each day. Reading food labels   The Nutrition Facts label lists the amount of sodium in one serving of the food. If you eat more than one serving, you must multiply the listed amount of sodium by the number of servings.  Choose foods with less than 140 mg of sodium per serving.  Avoid foods with 300 mg of sodium or more per serving. Shopping   Look for lower-sodium products, often labeled as "low-sodium" or "no salt added."  Always check the sodium content even if foods are labeled as "unsalted" or "no salt added".  Buy fresh foods.  Avoid canned foods and premade or frozen meals.  Avoid canned, cured, or processed meats  Buy breads that have less than 80 mg of sodium per slice. Cooking   Eat more home-cooked food and less restaurant, buffet, and fast food.  Avoid adding salt when cooking. Use salt-free seasonings or herbs instead of table salt or sea salt. Check with your health care provider or pharmacist before using salt substitutes.  Cook with plant-based oils, such as canola, sunflower, or olive oil. Meal planning   When eating at a restaurant, ask that your food be prepared with less salt or no salt, if possible.  Avoid foods that contain MSG (monosodium glutamate). MSG is sometimes added to [place] food, bouillon, and some canned foods. What foods are recommended? The items listed may  not be a complete list. Talk with your dietitian about what dietary choices are best for you. Grains  Low-sodium cereals, including oats, puffed wheat and rice, and shredded wheat. Low-sodium crackers. Unsalted rice. Unsalted pasta. Low-sodium bread. Whole-grain breads and whole-grain pasta. Vegetables  Fresh or frozen vegetables. "No salt added" canned vegetables. "No salt added" tomato sauce and paste. Low-sodium or reduced-sodium tomato and vegetable juice. Fruits  Fresh, frozen, or canned fruit. Fruit juice. Meats and other protein foods  Fresh or frozen (no salt added) meat, poultry, seafood, and fish. Low-sodium canned tuna and salmon. Unsalted nuts. Dried peas, beans, and lentils without added salt. Unsalted canned beans. Eggs. Unsalted nut butters. Dairy  Milk. Soy milk. Cheese that is naturally low in sodium, such as ricotta cheese, fresh mozzarella, or Swiss cheese Low-sodium or  reduced-sodium cheese. Cream cheese. Yogurt. Fats and oils  Unsalted butter. Unsalted margarine with no trans fat. Vegetable oils such as canola or olive oils. Seasonings and other foods  Fresh and dried herbs and spices. Salt-free seasonings. Low-sodium mustard and ketchup. Sodium-free salad dressing. Sodium-free light mayonnaise. Fresh or refrigerated horseradish. Lemon juice. Vinegar. Homemade, reduced-sodium, or low-sodium soups. Unsalted popcorn and pretzels. Low-salt or salt-free chips. What foods are not recommended? The items listed may not be a complete list. Talk with your dietitian about what dietary choices are best for you. Grains  Instant hot cereals. Bread stuffing, pancake, and biscuit mixes. Croutons. Seasoned rice or pasta mixes. Noodle soup cups. Boxed or frozen macaroni and cheese. Regular salted crackers. Self-rising flour. Vegetables  Sauerkraut, pickled vegetables, and relishes. Olives. [place] fries. Onion rings. Regular canned vegetables (not low-sodium or reduced-sodium). Regular canned  tomato sauce and paste (not low-sodium or reduced-sodium). Regular tomato and vegetable juice (not low-sodium or reduced-sodium). Frozen vegetables in sauces. Meats and other protein foods  Meat or fish that is salted, canned, smoked, spiced, or pickled. Bacon, ham, sausage, hotdogs, corned beef, chipped beef, packaged lunch meats, salt pork, jerky, pickled herring, anchovies, regular canned tuna, sardines, salted nuts. Dairy  Processed cheese and cheese spreads. Cheese curds. Blue cheese. Feta cheese. String cheese. Regular cottage cheese. Buttermilk. Canned milk. Fats and oils  Salted butter. Regular margarine. Ghee. Bacon fat. Seasonings and other foods  Onion salt, garlic salt, seasoned salt, table salt, and sea salt. Canned and packaged gravies. Worcestershire sauce. Tartar sauce. Barbecue sauce. Teriyaki sauce. Soy sauce, including reduced-sodium. Steak sauce. Fish sauce. Oyster sauce. Cocktail sauce. Horseradish that you find on the shelf. Regular ketchup and mustard. Meat flavorings and tenderizers. Bouillon cubes. Hot sauce and Tabasco sauce. Premade or packaged marinades. Premade or packaged taco seasonings. Relishes. Regular salad dressings. Salsa. Potato and tortilla chips. Corn chips and puffs. Salted popcorn and pretzels. Canned or dried soups. Pizza. Frozen entrees and pot pies. Summary  Eating less sodium can help lower your blood pressure, reduce swelling, and protect your heart, liver, and kidneys.  Most people on this plan should limit their sodium intake to 1,500-2,000 mg (milligrams) of sodium each day.  Canned, boxed, and frozen foods are high in sodium. Restaurant foods, fast foods, and pizza are also very high in sodium. You also get sodium by adding salt to food.  Try to cook at home, eat more fresh fruits and vegetables, and eat less fast food, canned, processed, or prepared foods. This information is not intended to replace advice given to you by your health care  provider. Make sure you discuss any questions you have with your health care provider. Document Released: 02/10/2002 Document Revised: 08/14/2016 Document Reviewed: 08/14/2016 Elsevier Interactive Patient Education  2017 [employer].   If you need a refill on your cardiac medications before your next appointment, please call your pharmacy.      Sumner Boast, PA-C  11/06/2016 8:51 AM    [facility] [address], [place], [place]  [address] Phone: [phone]; Fax: [phone]

## 2016-11-06 NOTE — Addendum Note (Signed)
Addended by: Imogene Burn on: 11/06/2016 09:07 AM   Modules accepted: Level of Service

## 2016-11-06 NOTE — Patient Instructions (Addendum)
Medication Instructions:  Your physician recommends that you continue on your current medications as directed. Please refer to the Current Medication list given to you today.  Labwork: TODAY:  BMET   Testing/Procedures: None ordered  Follow-Up: Your physician recommends that you schedule a follow-up appointment in: [place]   Any Other Special Instructions Will Be Listed Below (If Applicable). Daily Weight Record It is important to weigh yourself daily. Keep this daily weight chart near your scale. Weigh yourself each morning at the same time. Weigh yourself without shoes, and wear the same amount of clothing each day. Compare today's weight to yesterday's weight. Bring this form with you to your follow-up appointments. Call your health care provider if you have concerns about your weight, including rapid weight gain or rapid weight loss.  Date: ________ Weight: ____________________ Date: ________ Weight: ____________________ Date: ________ Weight: ____________________ Date: ________ Weight: ____________________ Date: ________ Weight: ____________________ Date: ________ Weight: ____________________ Date: ________ Weight: ____________________ Date: ________ Weight: ____________________ Date: ________ Weight: ____________________ Date: ________ Weight: ____________________ Date: ________ Weight: ____________________ Date: ________ Weight: ____________________ Date: ________ Weight: ____________________ Date: ________ Weight: ____________________ Date: ________ Weight: ____________________ Date: ________ Weight: ____________________ Date: ________ Weight: ____________________ Date: ________ Weight: ____________________ Date: ________ Weight: ____________________ Date: ________ Weight: ____________________ Date: ________ Weight: ____________________ Date: ________ Weight: ____________________ Date: ________ Weight: ____________________ Date: ________ Weight:  ____________________ Date: ________ Weight: ____________________ Date: ________ Weight: ____________________ Date: ________ Weight: ____________________ Date: ________ Weight: ____________________ Date: ________ Weight: ____________________ Date: ________ Weight: ____________________ Date: ________ Weight: ____________________ Date: ________ Weight: ____________________ Date: ________ Weight: ____________________ Date: ________ Weight: ____________________ Date: ________ Weight: ____________________ Date: ________ Weight: ____________________ Date: ________ Weight: ____________________ Date: ________ Weight: ____________________ Date: ________ Weight: ____________________ Date: ________ Weight: ____________________ Date: ________ Weight: ____________________ Date: ________ Weight: ____________________ Date: ________ Weight: ____________________ Date: ________ Weight: ____________________ Date: ________ Weight: ____________________ Date: ________ Weight: ____________________ Date: ________ Weight: ____________________ Date: ________ Weight: ____________________ Date: ________ Weight: ____________________ Date: ________ Weight: ____________________ This information is not intended to replace advice given to you by your health care provider. Make sure you discuss any questions you have with your health care provider. Document Released: 11/02/2006 Document Revised: 08/04/2016 Document Reviewed: 03/20/2014 Elsevier Interactive Patient Education  2017 [employer].   Low-Sodium Eating Plan Sodium, which is an element that makes up salt, helps you maintain a healthy balance of fluids in your body. Too much sodium can increase your blood pressure and cause fluid and waste to be held in your body. Your health care provider or dietitian may recommend following this plan if you have high blood pressure (hypertension), kidney disease, liver disease, or heart failure. Eating less sodium can help lower your  blood pressure, reduce swelling, and protect your heart, liver, and kidneys. What are tips for following this plan? General guidelines   Most people on this plan should limit their sodium intake to 1,500-2,000 mg (milligrams) of sodium each day. Reading food labels   The Nutrition Facts label lists the amount of sodium in one serving of the food. If you eat more than one serving, you must multiply the listed amount of sodium by the number of servings.  Choose foods with less than 140 mg of sodium per serving.  Avoid foods with 300 mg of sodium or more per serving. Shopping   Look for lower-sodium products, often labeled as "low-sodium" or "no salt added."  Always check the sodium content even if foods are labeled as "unsalted" or "no salt added".  Buy fresh foods.  Avoid canned foods and premade or frozen meals.  Avoid canned, cured, or processed meats  Buy breads that have less than 80 mg of sodium per slice. Cooking   Eat more home-cooked food and less restaurant, buffet, and fast food.  Avoid adding salt when cooking. Use salt-free seasonings or herbs instead of table salt or sea salt. Check with your health care provider or pharmacist before using salt substitutes.  Cook with plant-based oils, such as canola, sunflower, or olive oil. Meal planning   When eating at a restaurant, ask that your food be prepared with less salt or no salt, if possible.  Avoid foods that contain MSG (monosodium glutamate). MSG is sometimes added to [place] food, bouillon, and some canned foods. What foods are recommended? The items listed may not be a complete list. Talk with your dietitian about what dietary choices are best for you. Grains  Low-sodium cereals, including oats, puffed wheat and rice, and shredded wheat. Low-sodium crackers. Unsalted rice. Unsalted pasta. Low-sodium bread. Whole-grain breads and whole-grain pasta. Vegetables  Fresh or frozen vegetables. "No salt added" canned  vegetables. "No salt added" tomato sauce and paste. Low-sodium or reduced-sodium tomato and vegetable juice. Fruits  Fresh, frozen, or canned fruit. Fruit juice. Meats and other protein foods  Fresh or frozen (no salt added) meat, poultry, seafood, and fish. Low-sodium canned tuna and salmon. Unsalted nuts. Dried peas, beans, and lentils without added salt. Unsalted canned beans. Eggs. Unsalted nut butters. Dairy  Milk. Soy milk. Cheese that is naturally low in sodium, such as ricotta cheese, fresh mozzarella, or Swiss cheese Low-sodium or reduced-sodium cheese. Cream cheese. Yogurt. Fats and oils  Unsalted butter. Unsalted margarine with no trans fat. Vegetable oils such as canola or olive oils. Seasonings and other foods  Fresh and dried herbs and spices. Salt-free seasonings. Low-sodium mustard and ketchup. Sodium-free salad dressing. Sodium-free light mayonnaise. Fresh or refrigerated horseradish. Lemon juice. Vinegar. Homemade, reduced-sodium, or low-sodium soups. Unsalted popcorn and pretzels. Low-salt or salt-free chips. What foods are not recommended? The items listed may not be a complete list. Talk with your dietitian about what dietary choices are best for you. Grains  Instant hot cereals. Bread stuffing, pancake, and biscuit mixes. Croutons. Seasoned rice or pasta mixes. Noodle soup cups. Boxed or frozen macaroni and cheese. Regular salted crackers. Self-rising flour. Vegetables  Sauerkraut, pickled vegetables, and relishes. Olives. [place] fries. Onion rings. Regular canned vegetables (not low-sodium or reduced-sodium). Regular canned tomato sauce and paste (not low-sodium or reduced-sodium). Regular tomato and vegetable juice (not low-sodium or reduced-sodium). Frozen vegetables in sauces. Meats and other protein foods  Meat or fish that is salted, canned, smoked, spiced, or pickled. Bacon, ham, sausage, hotdogs, corned beef, chipped beef, packaged lunch meats, salt pork, jerky,  pickled herring, anchovies, regular canned tuna, sardines, salted nuts. Dairy  Processed cheese and cheese spreads. Cheese curds. Blue cheese. Feta cheese. String cheese. Regular cottage cheese. Buttermilk. Canned milk. Fats and oils  Salted butter. Regular margarine. Ghee. Bacon fat. Seasonings and other foods  Onion salt, garlic salt, seasoned salt, table salt, and sea salt. Canned and packaged gravies. Worcestershire sauce. Tartar sauce. Barbecue sauce. Teriyaki sauce. Soy sauce, including reduced-sodium. Steak sauce. Fish sauce. Oyster sauce. Cocktail sauce. Horseradish that you find on the shelf. Regular ketchup and mustard. Meat flavorings and tenderizers. Bouillon cubes. Hot sauce and Tabasco sauce. Premade or packaged marinades. Premade or packaged taco seasonings. Relishes. Regular salad dressings. Salsa. Potato and tortilla chips. Corn  chips and puffs. Salted popcorn and pretzels. Canned or dried soups. Pizza. Frozen entrees and pot pies. Summary  Eating less sodium can help lower your blood pressure, reduce swelling, and protect your heart, liver, and kidneys.  Most people on this plan should limit their sodium intake to 1,500-2,000 mg (milligrams) of sodium each day.  Canned, boxed, and frozen foods are high in sodium. Restaurant foods, fast foods, and pizza are also very high in sodium. You also get sodium by adding salt to food.  Try to cook at home, eat more fresh fruits and vegetables, and eat less fast food, canned, processed, or prepared foods. This information is not intended to replace advice given to you by your health care provider. Make sure you discuss any questions you have with your health care provider. Document Released: 02/10/2002 Document Revised: 08/14/2016 Document Reviewed: 08/14/2016 Elsevier Interactive Patient Education  2017 [employer].   If you need a refill on your cardiac medications before your next appointment, please call your pharmacy.

## 2016-11-06 NOTE — Telephone Encounter (Signed)
Patient had TCM visit with Ermalinda Barrios, PA today

## 2016-11-09 ENCOUNTER — Ambulatory Visit: Payer: Medicare HMO | Admitting: Physician Assistant

## 2016-12-07 ENCOUNTER — Encounter: Payer: Self-pay | Admitting: Cardiology

## 2016-12-07 ENCOUNTER — Ambulatory Visit (INDEPENDENT_AMBULATORY_CARE_PROVIDER_SITE_OTHER): Payer: Medicare HMO | Admitting: Cardiology

## 2016-12-07 VITALS — BP 132/76 | HR 90 | Ht 66.0 in | Wt 320.0 lb

## 2016-12-07 DIAGNOSIS — I5032 Chronic diastolic (congestive) heart failure: Secondary | ICD-10-CM | POA: Diagnosis not present

## 2016-12-07 DIAGNOSIS — R609 Edema, unspecified: Secondary | ICD-10-CM

## 2016-12-07 DIAGNOSIS — J9621 Acute and chronic respiratory failure with hypoxia: Secondary | ICD-10-CM | POA: Diagnosis not present

## 2016-12-07 DIAGNOSIS — I10 Essential (primary) hypertension: Secondary | ICD-10-CM | POA: Diagnosis not present

## 2016-12-07 DIAGNOSIS — I5033 Acute on chronic diastolic (congestive) heart failure: Secondary | ICD-10-CM

## 2016-12-07 DIAGNOSIS — E662 Morbid (severe) obesity with alveolar hypoventilation: Secondary | ICD-10-CM

## 2016-12-07 DIAGNOSIS — J9622 Acute and chronic respiratory failure with hypercapnia: Secondary | ICD-10-CM

## 2016-12-07 NOTE — Progress Notes (Signed)
Cardiology Office Note    Date:  12/07/2016   ID:  RYLANN MUNFORD, DOB [birth year], MRN [id number]  PCP:  Curly Rim, MD  Cardiologist: Dr. Meda Coffee  Chief Complaint  Patient presents with  . Appointment    has 'a place' on her left leg. swelling in her legs.    History of Present Illness:  [name] [name] is a 61 y.o. female  with h/o HTN, squamous cell cancer of the vulva, s/p resection, currently in remission presented for evaluation of dyspnea on exertion in clinic and directly admitted 10/30/16 for acute on chronic diastolic CHF with weight gain of approximately 44 since october 2017.   The patient states that ever since she had her surgery and lymph node removal from both of her groins she developed significant lower extremity edema, became less active and gained a lot of weight.   She diuresed with Lasix 80 mg IV BID and metolazone 2.5 mg daily Echo with normal EF grade 2 DD. Diuresed 16 L 20 lbs. Weight 347-327 lbs. Crt improved 1.32.   12/07/2016  - four-week follow-up, patient is feeling significantly better, she continues to lose weight altogether 38 pounds in 6 weeks, she continues to take Lasix 80 morning and 40 in the afternoon. She denies any chest pain, continues to wear home oxygen 2 L 24/7. No palpitations presyncope or syncope. She hit her leg on her back with a small wound on the left showing that has been losing but there is no erythema or chills.   Past Medical History:  Diagnosis Date  . Bronchitis, mucopurulent recurrent ([place])   . Carpal tunnel syndrome    bilateral  . CHF (congestive heart failure) ([place])   . Diabetes mellitus without complication ([place])   . Diastolic heart failure ([place])    Echo 11/14 LVEF 55%. Ggrade 2 diastolic dysfunction  . DOE (dyspnea on exertion)   . Esophageal reflux   . Essential hypertension, benign   . Former smoker   . Gout   . Lymphedema   . Morbid obesity ([place])   . Osteoarthrosis, unspecified whether generalized or localized,  unspecified site   . Other and unspecified hyperlipidemia   . Polyp of nasal cavity   . Vulva cancer ([place])    resected 2002    Past Surgical History:  Procedure Laterality Date  . CESAREAN SECTION    . COLONOSCOPY W/ POLYPECTOMY     negative polyps  . LYMPHADENECTOMY Bilateral    inguinal   . RADICAL VULVECTOMY    . TOTAL KNEE ARTHROPLASTY Right 01/2014   [facility] (Dr Rito Ehrlich)    Current Medications: Outpatient Medications Prior to Visit  Medication Sig Dispense Refill  . albuterol (PROVENTIL HFA;VENTOLIN HFA) 108 (90 BASE) MCG/ACT inhaler INHALE 2 PUFFS INTO THE LUNGS EVERY 4 HOURS AS NEEDED FOR SHORTNESS OF BREATH    . allopurinol (ZYLOPRIM) 300 MG tablet Take 300 mg by mouth daily.    Marland Kitchen amitriptyline (ELAVIL) 10 MG tablet Take 10 mg by mouth at bedtime.    Marland Kitchen amLODipine (NORVASC) 5 MG tablet Take 1 tablet (5 mg total) by mouth daily. 30 tablet 6  . aspirin 81 MG chewable tablet Chew 81 mg by mouth daily.    . budesonide-formoterol (SYMBICORT) 160-4.5 MCG/ACT inhaler Inhale 2 puffs into the lungs 2 (two) times daily. 1 Inhaler 2  . furosemide (LASIX) 40 MG tablet Take 80 mg (2 tablet)  by mouth in the AM and 40 mg (1tablet) po six  hours later. 90 tablet 6  . metFORMIN (GLUCOPHAGE) 500 MG tablet Take 1 tablet (500 mg total) by mouth 2 (two) times daily with a meal. 60 tablet 1  . oxybutynin (DITROPAN-XL) 10 MG 24 hr tablet Take 1 tablet by mouth daily.  2  . pantoprazole (PROTONIX) 40 MG tablet Take 40 mg by mouth daily.    . pravastatin (PRAVACHOL) 20 MG tablet Take 20 mg by mouth daily.     No facility-administered medications prior to visit.      Allergies:   Penicillins and Sulfa antibiotics   Social History   Social History  . Marital status: Married    Spouse name: N/A  . Number of children: 1  . Years of education: N/A   Occupational History  . retired    Social History Main Topics  . Smoking status: Former Smoker    Packs/day: 1.50    Years:  25.00    Types: Cigarettes    Quit date: 09/05/1999  . Smokeless tobacco: Never Used  . Alcohol use Yes  . Drug use: No  . Sexual activity: Not Asked   Other Topics Concern  . None   Social History Narrative   Disabled due to obesity and osteoarthritis     Family History:  The patient's   family history includes COPD in her brother; Congestive Heart Failure in her mother; Diabetes type II in her father; Healthy in her brother, brother, and sister; Heart attack in her brother; Heart disease in her brother; Hypertension in her sister; Kidney failure in her mother; Stroke in her father.   ROS:   Please see the history of present illness.    Review of Systems  Constitution: Positive for weight gain.  HENT: Negative.   Eyes: Negative.   Cardiovascular: Positive for dyspnea on exertion and leg swelling.  Respiratory: Positive for shortness of breath.   Hematologic/Lymphatic: Negative.   Musculoskeletal: Negative.  Negative for joint pain.  Gastrointestinal: Negative.   Genitourinary: Negative.   Neurological: Negative.    All other systems reviewed and are negative.   PHYSICAL EXAM:   VS:  BP 132/76   Pulse 90   Ht 5\' 6"  (1.676 m)   Wt (!) 320 lb (145.2 kg)   BMI 51.65 kg/m   Physical Exam  GEN: Obese, in no acute distress on oxygen Neck: no JVD, carotid bruits, or masses Cardiac:RRR; no murmurs, rubs, or gallops  Respiratory:  clear to auscultation bilaterally, normal work of breathing GI: soft, nontender, nondistended, + BS Ext: Chronic lymphedema 2+, small oozing wound on left shin, no erythema Psych: euthymic mood, full affect  Wt Readings from Last 3 Encounters:  12/07/16 (!) 320 lb (145.2 kg)  11/06/16 (!) 324 lb (147 kg)  11/03/16 (!) 327 lb 1.6 oz (148.4 kg)      Studies/Labs Reviewed:   EKG:  EKG is not ordered today.    Recent Labs: 10/30/2016: ALT 19; B Natriuretic Peptide 163.9; Hemoglobin 10.4; Magnesium 1.9; Platelets 282; TSH 1.507 11/06/2016: BUN 39;  Creatinine, Ser 1.31; Potassium 4.4; Sodium 131   Lipid Panel    Component Value Date/Time   CHOL 192 06/21/2016 0959   TRIG 128 06/21/2016 0959   HDL 47 06/21/2016 0959   CHOLHDL 4.1 06/21/2016 0959   VLDL 26 06/21/2016 0959   LDLCALC 119 06/21/2016 0959    Additional studies/ records that were reviewed today include:   Echo 10/31/16 Study Conclusions   - Left ventricle: The cavity size was moderately  dilated. Wall   thickness was increased in a pattern of mild LVH. Systolic   function was normal. The estimated ejection fraction was in the   range of 60% to 65%. Wall motion was normal; there were no   regional wall motion abnormalities. Doppler parameters are   consistent with pseudonormal left ventricular relaxation (grade 2   diastolic dysfunction). The E/e&' ratio is >15, suggesting   elevated LV filling pressure. - Aortic valve: Trileaflet. Sclerosis without stenosis. There was   no significant regurgitation. - Mitral valve: Mildly thickened leaflets . There was trivial   regurgitation. - Left atrium: The atrium was normal in size. - Tricuspid valve: There was moderate regurgitation. - Pulmonary arteries: PA peak pressure: 53 mm Hg (S). - Inferior vena cava: The vessel was dilated. The respirophasic   diameter changes were blunted (< 50%), consistent with elevated   central venous pressure.   Impressions:   - Compared to a prior study in 2015, the LVEF is stable. There is   now Grade 2 diastolic dysfunction with elevated left and right   heart pressures, moderate TR and RVSP of 53 mmHg and a dilated   IVC.   ECG: 12/07/16, personally interviewed shows normal sinus rhythm otherwise normal EKG.   ASSESSMENT:    1. Chronic diastolic CHF (congestive heart failure) ([place])   2. Acute on chronic respiratory failure with hypoxia and hypercapnia (H[facility])   3. Edema, unspecified type   4. 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 Essential hypertension   6. Acute on chronic diastolic CHF (congestive heart failure), NYHA class 3 (HCC)      ASSESSMENT AND PLAN  61 year old female with h/o vulvar squamous cell cancer with known diastolic CHF, morbid obesity-ventilation syndrome, on home oxygen therapy but presented with acute on chronic diastolic CHF and acute on chronic respiratory failure with hypoxia.  1. Acute on Chronic diastolic congestive heart failure - the patient has gained 38 pounds since February,continue laisx 80 mg po in the am and 40 mg po in the pm.  2. Acute on chronic respiratory failure with hypercapnia and hypoxia, on 2 L home oxygen. - we discontinued Carvedilol in the past as she has component of bronchospam   3. Hypertension - controlled.   4. Obesity - hypoventilation syndrome, lost weight the lats year.  5. Chronic lymphedema - improved after losing weight. A small wound on left shin doesn't appear infected, wrapping changed in the clinic, advised to apply ATB cream.   We will admit to telemetry, check BNP, CMP, TSH, CBC today. Check CXR.   Signed, Ena Dawley MD, [facility]    Medication Adjustments/Labs and Tests Ordered: Current medicines are reviewed at length with the patient today.  Concerns regarding medicines are outlined above.  Medication changes, Labs and Tests ordered today are listed in the Patient Instructions below. Patient Instructions  Medication Instructions:  Your physician recommends that you continue on your current medications as directed. Please refer to the Current Medication list given to you today.   Labwork: Your physician recommends that you return for lab work in: at next office visit for BMET, BNP    Testing/Procedures: None Ordered   Follow-Up: Your physician recommends that you schedule a follow-up appointment in: 2 months with Dr. Meda Coffee or APP on same day Dr. Meda Coffee is in the office   If you need a refill on your cardiac  medications before your next appointment, please call your  pharmacy.   Thank you for choosing [facility]! Christen Bame, RN [phone]       Signed, Ena Dawley, MD  12/07/2016 8:58 AM    [address], [place], [place]  [address] Phone: [phone]; Fax: [phone]

## 2016-12-07 NOTE — Patient Instructions (Signed)
Medication Instructions:  Your physician recommends that you continue on your current medications as directed. Please refer to the Current Medication list given to you today.   Labwork: Your physician recommends that you return for lab work in: at next office visit for BMET, BNP    Testing/Procedures: None Ordered   Follow-Up: Your physician recommends that you schedule a follow-up appointment in: 2 months with Dr. Meda Coffee or APP on same day Dr. Meda Coffee is in the office   If you need a refill on your cardiac medications before your next appointment, please call your pharmacy.   Thank you for choosing [facility]! Christen Bame, RN [phone]

## 2017-02-09 ENCOUNTER — Other Ambulatory Visit: Payer: Medicare HMO | Admitting: *Deleted

## 2017-02-09 ENCOUNTER — Encounter: Payer: Self-pay | Admitting: Cardiology

## 2017-02-09 ENCOUNTER — Encounter (INDEPENDENT_AMBULATORY_CARE_PROVIDER_SITE_OTHER): Payer: Self-pay

## 2017-02-09 ENCOUNTER — Ambulatory Visit (INDEPENDENT_AMBULATORY_CARE_PROVIDER_SITE_OTHER): Payer: Medicare HMO | Admitting: Cardiology

## 2017-02-09 VITALS — BP 122/68 | HR 98 | Ht 66.0 in | Wt 340.0 lb

## 2017-02-09 DIAGNOSIS — I5033 Acute on chronic diastolic (congestive) heart failure: Secondary | ICD-10-CM | POA: Diagnosis not present

## 2017-02-09 DIAGNOSIS — L03115 Cellulitis of right lower limb: Secondary | ICD-10-CM

## 2017-02-09 DIAGNOSIS — I5032 Chronic diastolic (congestive) heart failure: Secondary | ICD-10-CM

## 2017-02-09 DIAGNOSIS — I110 Hypertensive heart disease with heart failure: Secondary | ICD-10-CM

## 2017-02-09 DIAGNOSIS — L0390 Cellulitis, unspecified: Secondary | ICD-10-CM | POA: Insufficient documentation

## 2017-02-09 DIAGNOSIS — J9611 Chronic respiratory failure with hypoxia: Secondary | ICD-10-CM

## 2017-02-09 MED ORDER — FUROSEMIDE 80 MG PO TABS: 80.0000 mg | ORAL_TABLET | Freq: Two times a day (BID) | ORAL | 1 refills | Status: DC

## 2017-02-09 MED ORDER — CEPHALEXIN 500 MG PO CAPS: 500.0000 mg | ORAL_CAPSULE | Freq: Two times a day (BID) | ORAL | 0 refills | Status: DC

## 2017-02-09 MED ORDER — METOLAZONE 2.5 MG PO TABS: 2.5000 mg | ORAL_TABLET | ORAL | 2 refills | Status: DC

## 2017-02-09 NOTE — Progress Notes (Signed)
Cardiology Office Note    Date:  02/09/2017   ID:  [name] [name], DOB 1955/09/15, MRN [id number]  PCP:  Curly Rim, MD  Cardiologist: Dr. Meda [name]  Chief complain; worsening dyspnea on exertion  History of Present Illness:  [name] [name] is a 61 y.o. female  with h/o HTN, squamous cell cancer of the vulva, s/p resection, currently in remission presented for evaluation of dyspnea on exertion in clinic and directly admitted 10/30/16 for acute on chronic diastolic CHF with weight gain of approximately 44 since october 2017.   The patient states that ever since she had her surgery and lymph node removal from both of her groins she developed significant lower extremity edema, became less active and gained a lot of weight.   She diuresed with Lasix 80 mg IV BID and metolazone 2.5 mg daily Echo with normal EF grade 2 DD. Diuresed 16 L 20 lbs. Weight 347-327 lbs. Crt improved 1.32.   12/07/2016  - four-week follow-up, patient is feeling significantly better, she continues to lose weight altogether 38 pounds in 6 weeks, she continues to take Lasix 80 morning and 40 in the afternoon. She denies any chest pain, continues to wear home oxygen 2 L 24/7. No palpitations presyncope or syncope. She hit her leg on her back with a small wound on the left showing that has been losing but there is no erythema or chills.   02/09/2017 - 2 months follow-up, the patient reports acute bronchitis for which she had to take 2 courses of antibiotics and breathing treatments, she continues to be on home oxygen. She has gained 18 pounds since the last visit and has been experiencing paroxysmal nocturnal dyspnea. No chest pain. No syncope or falls. She denies any fever or chills.  Past Medical History:  Diagnosis Date  . Bronchitis, mucopurulent recurrent ([place])   . Carpal tunnel syndrome    bilateral  . CHF (congestive heart failure) ([place])   . Diabetes mellitus without complication ([place])   . Diastolic heart failure  ([place])    Echo 11/14 LVEF 55%. Ggrade 2 diastolic dysfunction  . DOE (dyspnea on exertion)   . Esophageal reflux   . Essential hypertension, benign   . Former smoker   . Gout   . Lymphedema   . Morbid obesity ([place])   . Osteoarthrosis, unspecified whether generalized or localized, unspecified site   . Other and unspecified hyperlipidemia   . Polyp of nasal cavity   . Vulva cancer ([place])    resected 2002    Past Surgical History:  Procedure Laterality Date  . CESAREAN SECTION    . COLONOSCOPY W/ POLYPECTOMY     negative polyps  . LYMPHADENECTOMY Bilateral    inguinal   . RADICAL VULVECTOMY    . TOTAL KNEE ARTHROPLASTY Right 01/2014   [facility] (Dr Rito Ehrlich)    Current Medications: Outpatient Medications Prior to Visit  Medication Sig Dispense Refill  . albuterol (PROVENTIL HFA;VENTOLIN HFA) 108 (90 BASE) MCG/ACT inhaler INHALE 2 PUFFS INTO THE LUNGS EVERY 4 HOURS AS NEEDED FOR SHORTNESS OF BREATH    . albuterol (PROVENTIL) (2.5 MG/3ML) 0.083% nebulizer solution Use one unit dose in nebulizer every 4 hours as needed for wheezing    . allopurinol (ZYLOPRIM) 300 MG tablet Take 300 mg by mouth daily.    Marland Kitchen amitriptyline (ELAVIL) 10 MG tablet Take 10 mg by mouth at bedtime.    Marland Kitchen amLODipine (NORVASC) 5 MG tablet Take 1 tablet (5 mg  total) by mouth daily. 30 tablet 6  . aspirin 81 MG chewable tablet Chew 81 mg by mouth daily.    . budesonide-formoterol (SYMBICORT) 160-4.5 MCG/ACT inhaler Inhale 2 puffs into the lungs 2 (two) times daily. 1 Inhaler 2  . lisinopril (PRINIVIL,ZESTRIL) 5 MG tablet Take 5 mg by mouth daily.    . metFORMIN (GLUCOPHAGE) 500 MG tablet Take 1 tablet (500 mg total) by mouth 2 (two) times daily with a meal. 60 tablet 1  . oxybutynin (DITROPAN-XL) 10 MG 24 hr tablet Take 1 tablet by mouth daily.  2  . pantoprazole (PROTONIX) 40 MG tablet Take 40 mg by mouth daily.    . pravastatin (PRAVACHOL) 20 MG tablet Take 20 mg by mouth daily.    Marland Kitchen buPROPion  (WELLBUTRIN XL) 150 MG 24 hr tablet Take 150 mg by mouth daily.    . furosemide (LASIX) 40 MG tablet Take 80 mg (2 tablet)  by mouth in the AM and 40 mg (1tablet) po six hours later. 90 tablet 6   No facility-administered medications prior to visit.      Allergies:   Penicillins and Sulfa antibiotics   Social History   Social History  . Marital status: Married    Spouse name: N/A  . Number of children: 1  . Years of education: N/A   Occupational History  . retired    Social History Main Topics  . Smoking status: Former Smoker    Packs/day: 1.50    Years: 25.00    Types: Cigarettes    Quit date: 09/05/1999  . Smokeless tobacco: Never Used  . Alcohol use Yes  . Drug use: No  . Sexual activity: Not Asked   Other Topics Concern  . None   Social History Narrative   Disabled due to obesity and osteoarthritis     Family History:  The patient's   family history includes COPD in her brother; Congestive Heart Failure in her mother; Diabetes type II in her father; Healthy in her brother, brother, and sister; Heart attack in her brother; Heart disease in her brother; Hypertension in her sister; Kidney failure in her mother; Stroke in her father.   ROS:   Please see the history of present illness.    Review of Systems  Constitution: Positive for weight gain.  HENT: Negative.   Eyes: Negative.   Cardiovascular: Positive for dyspnea on exertion and leg swelling.  Respiratory: Positive for shortness of breath.   Hematologic/Lymphatic: Negative.   Musculoskeletal: Negative.  Negative for joint pain.  Gastrointestinal: Negative.   Genitourinary: Negative.   Neurological: Negative.    All other systems reviewed and are negative.   PHYSICAL EXAM:   VS:  BP 122/68   Pulse 98   Ht 5\' 6"  (1.676 m)   Wt (!) 340 lb (154.2 kg)   SpO2 97%   BMI 54.88 kg/m   Physical Exam  GEN: Obese, in no acute distress on oxygen Neck: no JVD, carotid bruits, or masses Cardiac:RRR; no murmurs,  rubs, or gallops  Respiratory:  Mild crackles at the bases bilaterally, normal work of breathing GI: soft, nontender, nondistended, + BS Ext: Chronic lymphedema 2+, There is erythema on the anterior shins bilaterally with mildly increased warmth Psych: euthymic mood, full affect  Wt Readings from Last 3 Encounters:  02/09/17 (!) 340 lb (154.2 kg)  12/07/16 (!) 320 lb (145.2 kg)  11/06/16 (!) 324 lb (147 kg)      Studies/Labs Reviewed:   EKG:  EKG  is not ordered today.    Recent Labs: 10/30/2016: ALT 19; B Natriuretic Peptide 163.9; Hemoglobin 10.4; Magnesium 1.9; Platelets 282; TSH 1.507 11/06/2016: BUN 39; Creatinine, Ser 1.31; Potassium 4.4; Sodium 131   Lipid Panel    Component Value Date/Time   CHOL 192 06/21/2016 0959   TRIG 128 06/21/2016 0959   HDL 47 06/21/2016 0959   CHOLHDL 4.1 06/21/2016 0959   VLDL 26 06/21/2016 0959   LDLCALC 119 06/21/2016 0959    Additional studies/ records that were reviewed today include:   Echo 10/31/16 Study Conclusions   - Left ventricle: The cavity size was moderately dilated. Wall   thickness was increased in a pattern of mild LVH. Systolic   function was normal. The estimated ejection fraction was in the   range of 60% to 65%. Wall motion was normal; there were no   regional wall motion abnormalities. Doppler parameters are   consistent with pseudonormal left ventricular relaxation (grade 2   diastolic dysfunction). The E/e&' ratio is >15, suggesting   elevated LV filling pressure. - Aortic valve: Trileaflet. Sclerosis without stenosis. There was   no significant regurgitation. - Mitral valve: Mildly thickened leaflets . There was trivial   regurgitation. - Left atrium: The atrium was normal in size. - Tricuspid valve: There was moderate regurgitation. - Pulmonary arteries: PA peak pressure: 53 mm Hg (S). - Inferior vena cava: The vessel was dilated. The respirophasic   diameter changes were blunted (< 50%), consistent with  elevated   central venous pressure.   Impressions:   - Compared to a prior study in 2015, the LVEF is stable. There is   now Grade 2 diastolic dysfunction with elevated left and right   heart pressures, moderate TR and RVSP of 53 mmHg and a dilated   IVC.   ECG: 12/07/16, personally interviewed shows normal sinus rhythm otherwise normal EKG.   ASSESSMENT:    1. Acute on chronic diastolic CHF (congestive heart failure), NYHA class 3 ([place])   2. Cellulitis of right lower extremity      ASSESSMENT AND PLAN  61 year old female with h/o vulvar squamous cell cancer with known diastolic CHF, morbid obesity-ventilation syndrome, on home oxygen therapy but presented with acute on chronic diastolic CHF and acute on chronic respiratory failure with hypoxia.  1. Acute on Chronic diastolic congestive heart failure - the patient has gained 18 pounds since April, we will increase Lasix to 80 mg by mouth twice a day and add metolazone 2.5 mg on Tuesdays Thursdays and Saturdays.  2. Acute on chronic respiratory failure with hypercapnia and hypoxia, on 2 L home oxygen. - we discontinued Carvedilol in the past as she has component of bronchospam   3. Hypertension - controlled.   4. Obesity - hypoventilation syndrome, lost weight the lats year.  5. Chronic lymphedema - improved after losing weight. A small wound on left shin doesn't appear infected, wrapping changed in the clinic, advised to apply ATB cream.  6. Acute cellulitis - we will start Keflex 500 mg by mouth twice a day 5 days.   Follow-up in 3 weeks with BMP and BNP.  Signed, [name] Dawley MD, [facility]   Medication Adjustments/Labs and Tests Ordered: Current medicines are reviewed at length with the patient today.  Concerns regarding medicines are outlined above.  Medication changes, Labs and Tests ordered today are listed in the Patient Instructions below. Patient Instructions  Medication Instructions:   START TAKING KEFLEX 500 MG  BY MOUTH TWICE  DAILY FOR 5 DAYS ONLY   START TAKING METOLAZONE 2.5 MG BY MOUTH EVERY OTHER DAY---DR [name] [name] WANTS YOU TO TAKE THIS ON Tuesday, Thursday, AND SATURDAYS---PLEASE TAKE THIS PILL 30 MINUTES PRIOR TOO YOUR MORNING DOSE OF LASIX   INCREASE YOUR LASIX TO 80 MG BY MOUTH TWICE DAILY      Follow-Up:  3 WEEKS WITH AN EXTENDER ON DR [name] [name]'S TEAM       If you need a refill on your cardiac medications before your next appointment, please call your pharmacy.      Signed, [name] Dawley, MD  02/09/2017 2:00 PM    [address], [place], [place]  [address] Phone: [phone]; Fax: [phone]

## 2017-02-09 NOTE — Addendum Note (Signed)
Addended by: Marcellina Millin on: 02/09/2017 07:40 AM   Modules accepted: Orders

## 2017-02-09 NOTE — Progress Notes (Signed)
nbp

## 2017-02-09 NOTE — Patient Instructions (Signed)
Medication Instructions:   START TAKING KEFLEX 500 MG BY MOUTH TWICE DAILY FOR 5 DAYS ONLY   START TAKING METOLAZONE 2.5 MG BY MOUTH EVERY OTHER DAY---DR Meda Coffee WANTS YOU TO TAKE THIS ON Tuesday, Thursday, AND SATURDAYS---PLEASE TAKE THIS PILL 30 MINUTES PRIOR TOO YOUR MORNING DOSE OF LASIX   INCREASE YOUR LASIX TO 80 MG BY MOUTH TWICE DAILY      Follow-Up:  3 WEEKS WITH AN EXTENDER ON DR NELSON'S TEAM       If you need a refill on your cardiac medications before your next appointment, please call your pharmacy.

## 2017-02-12 LAB — PRO B NATRIURETIC PEPTIDE: NT-Pro BNP: 173 pg/mL (ref 0–287)

## 2017-02-12 LAB — BASIC METABOLIC PANEL
BUN/Creatinine Ratio: 28 (ref 12–28)
BUN: 28 mg/dL — ABNORMAL HIGH (ref 8–27)
CO2: 30 mmol/L — ABNORMAL HIGH (ref 18–29)
Calcium: 9.5 mg/dL (ref 8.7–10.3)
Chloride: 95 mmol/L — ABNORMAL LOW (ref 96–106)
Creatinine, Ser: 1 mg/dL (ref 0.57–1.00)
GFR calc Af Amer: 71 mL/min/{1.73_m2} (ref >59)
GFR calc non Af Amer: 61 mL/min/{1.73_m2} (ref >59)
Glucose: 123 mg/dL — ABNORMAL HIGH (ref 65–99)
Potassium: 5.1 mmol/L (ref 3.5–5.2)
Sodium: 139 mmol/L (ref 134–144)

## 2017-03-05 ENCOUNTER — Encounter: Payer: Self-pay | Admitting: Physician Assistant

## 2017-03-05 NOTE — Progress Notes (Signed)
Cardiology Office Note    Date:  03/06/2017  ID:  [name] [name], DOB 1956-07-11, MRN [id number] PCP:  Curly Rim, MD  Cardiologist:  Dr. Meda Coffee   Chief Complaint: f/u CHF  History of Present Illness:  [name] [name] is a 61 y.o. 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 In 10/2016 she required admission for a/c CHF requiring IV Lasix and metolazone with 2D Echo 10/31/16 showing mild LVH, EF 60-65%, grade 2 DD, moderate TR. She diuresed from 347 to 327lb. More recently she was seen 02/09/17 by Dr. Meda Coffee at which time she had gained 18lb since April and was also felt to have cellulitis. Lasix was increased to 80mg  BID (was on 80mg  qam/40mg  qpm) and metolazone added 3x/week along with Keflex. Labs showed BUN 28, Cr 1.00, CO2 30, K 5.1, BNP 173 - prior Cr had run 1.3-1.5.  She returns back for follow-up feeling much better. Weight is back down to 321lb. She has been monitoring her sodium intake. Although she still has impressive edema on exam, she states that Dr. Meda Coffee would be shocked how good they look and how much they have improved. She does not have any weeping or nonhealing ulcers. She states PCP took her off lisinopril recently due to an ongoing cough ever since an episode of bronchitis - this is on hold for now. BP is well controlled. States she is unable to exercise due to knee issues.   Past Medical History:  Diagnosis Date  . Bronchitis, mucopurulent recurrent ([place])   . Carpal tunnel syndrome    bilateral  . Chronic diastolic CHF (congestive heart failure) ([place])   . Chronic respiratory failure ([place])   . CKD (chronic kidney disease), stage III   . Diabetes mellitus ([place])   . Esophageal reflux   . Essential  hypertension   . Former smoker   . Gout   . Lymphedema   . Morbid obesity ([place])   . Obesity hypoventilation syndrome ([place])   . On home oxygen therapy   . Osteoarthrosis, unspecified whether generalized or localized, unspecified site   . Other and unspecified hyperlipidemia   . Polyp of nasal cavity   . Vulva cancer ([place])    resected 2002    Past Surgical History:  Procedure Laterality Date  . CESAREAN SECTION    . COLONOSCOPY W/ POLYPECTOMY     negative polyps  . LYMPHADENECTOMY Bilateral    inguinal   . RADICAL VULVECTOMY    . TOTAL KNEE ARTHROPLASTY Right 01/2014   [facility] (Dr Rito Ehrlich)    Current Medications: Current Meds  Medication Sig  . albuterol (PROVENTIL HFA;VENTOLIN HFA) 108 (90 BASE) MCG/ACT inhaler INHALE 2 PUFFS INTO THE LUNGS EVERY 4 HOURS AS NEEDED FOR SHORTNESS OF BREATH  . albuterol (PROVENTIL) (2.5 MG/3ML) 0.083% nebulizer solution Use one unit dose in nebulizer every 4 hours as needed for wheezing  . allopurinol (ZYLOPRIM) 300 MG tablet Take 300 mg by mouth daily.  Marland Kitchen amLODipine (NORVASC) 5 MG tablet Take 1 tablet (5 mg total) by mouth daily.  Marland Kitchen aspirin 81 MG chewable tablet Chew 81 mg by mouth daily.  . budesonide-formoterol (SYMBICORT) 160-4.5 MCG/ACT inhaler Inhale 2 puffs into the lungs 2 (two) times daily.  . furosemide (LASIX)  80 MG tablet Take 1 tablet (80 mg total) by mouth 2 (two) times daily.  . metolazone (ZAROXOLYN) 2.5 MG tablet Take 1 tablet (2.5 mg total) by mouth every other day. Take on Tuesday, Thursday, and Saturday.  Marland Kitchen oxybutynin (DITROPAN-XL) 10 MG 24 hr tablet Take 1 tablet by mouth daily.  . pantoprazole (PROTONIX) 40 MG tablet Take 40 mg by mouth daily.  . pravastatin (PRAVACHOL) 20 MG tablet Take 20 mg by mouth daily.  . sitaGLIPtin (JANUVIA) 50 MG tablet Take 50 mg by mouth daily.     Allergies:   Penicillins and Sulfa antibiotics   Social History   Social History  . Marital status: Married    Spouse name: N/A    . Number of children: 1  . Years of education: N/A   Occupational History  . retired    Social History Main Topics  . Smoking status: Former Smoker    Packs/day: 1.50    Years: 25.00    Types: Cigarettes    Quit date: 09/05/1999  . Smokeless tobacco: Never Used  . Alcohol use Yes  . Drug use: No  . Sexual activity: Not Asked   Other Topics Concern  . None   Social History Narrative   Disabled due to obesity and osteoarthritis     Family History:  Family History  Problem Relation Age of Onset  . Congestive Heart Failure Mother   . Kidney failure Mother   . Stroke Father   . Diabetes type II Father   . Hypertension Sister   . Heart attack Brother   . Healthy Sister   . Heart disease Brother   . COPD Brother   . Healthy Brother   . Healthy Brother   . Hypertension Unknown        family history  . Rheum arthritis Unknown        family history  . Stroke Unknown        family history    ROS:   Please see the history of present illness. All other systems are reviewed and otherwise negative.    PHYSICAL EXAM:   VS:  BP 110/60   Pulse 92   Ht 5\' 6"  (1.676 m)   Wt (!) 321 lb 12.8 oz (146 kg)   BMI 51.94 kg/m   BMI: Body mass index is 51.94 kg/m. GEN: Well nourished, well developed morbidly obese WF, in no acute distress  HEENT: normocephalic, atraumatic Neck: no JVD, carotid bruits, or masses Cardiac: RRR; no murmurs, rubs, or gallops, 1-2+ bilateral LE edema superimposed on large leg habitus with chronic skin thickening changes Respiratory:  clear to auscultation bilaterally, normal work of breathing GI: soft, nontender, nondistended, + BS MS: no deformity or atrophy  Skin: warm and dry, no rash Neuro:  Alert and Oriented x 3, Strength and sensation are intact, follows commands Psych: euthymic mood, full affect  Wt Readings from Last 3 Encounters:  03/06/17 (!) 321 lb 12.8 oz (146 kg)  02/09/17 (!) 340 lb (154.2 kg)  12/07/16 (!) 320 lb (145.2 kg)       Studies/Labs Reviewed:   EKG:  EKG was not ordered today.  Recent Labs: 10/30/2016: ALT 19; B Natriuretic Peptide 163.9; Hemoglobin 10.4; Magnesium 1.9; Platelets 282; TSH 1.507 02/09/2017: BUN 28; Creatinine, Ser 1.00; NT-Pro BNP 173; Potassium 5.1; Sodium 139   Lipid Panel    Component Value Date/Time   CHOL 192 06/21/2016 0959   TRIG 128 06/21/2016 0959   HDL 47  06/21/2016 0959   CHOLHDL 4.1 06/21/2016 0959   VLDL 26 06/21/2016 0959   LDLCALC 119 06/21/2016 0959    Additional studies/ records that were reviewed today include: Summarized above    ASSESSMENT & PLAN:   1. Chronic diastolic CHF - volume status appears much improved per patient. Weight is down 19lb from last OV. Will check BMET today to help guide decision making regarding further metolazone. If she appears on the drier side we may need to scale this back. Long discussion with patient about 2g sodium diet, 2L fluid restriction, daily weights (and notifying our office of any weight gain 3-5lb before it gets to the point of as bad as it was before). Also discussed implications of longterm weight loss.  2. Morbid obesity - reviewed my concerns about her overall prognosis if she remains morbidly obese. We discussed that at her level of sedentary lifestyle she likely does not need to eat as many calories as she is eating now. I asked her to monitor this. We also discussed realistic goals for increasing activity including gradual increase in walking, even if just in small increments as it is more than she is doing now. 3. CKD stage III - recheck with BMET today. 4. Essential HTN - her PCP recently stopped lisinopril due to cough. Her BP is currently controlled. Long-term given her DM, she should probably be on an ARB if able to tolerate. It may be reasonable to consider swapping ARB in place of amlodipine in the future.   Disposition: F/u with Dr. Meda Coffee in 3 months.  Medication Adjustments/Labs and Tests  Ordered: Current medicines are reviewed at length with the patient today.  Concerns regarding medicines are outlined above. Medication changes, Labs and Tests ordered today are summarized above and listed in the Patient Instructions accessible in Encounters.   Signed, Charlie Pitter, PA-C  03/06/2017 8:04 AM    [facility] [address], [place], [place]  [address] Phone: [phone]; Fax: [phone]

## 2017-03-06 ENCOUNTER — Ambulatory Visit (INDEPENDENT_AMBULATORY_CARE_PROVIDER_SITE_OTHER): Payer: Medicare HMO | Admitting: Physician Assistant

## 2017-03-06 ENCOUNTER — Encounter: Payer: Self-pay | Admitting: Physician Assistant

## 2017-03-06 VITALS — BP 110/60 | HR 92 | Ht 66.0 in | Wt 321.8 lb

## 2017-03-06 DIAGNOSIS — I10 Essential (primary) hypertension: Secondary | ICD-10-CM

## 2017-03-06 DIAGNOSIS — N183 Chronic kidney disease, stage 3 unspecified: Secondary | ICD-10-CM

## 2017-03-06 DIAGNOSIS — I5032 Chronic diastolic (congestive) heart failure: Secondary | ICD-10-CM | POA: Diagnosis not present

## 2017-03-06 LAB — BASIC METABOLIC PANEL
BUN / CREAT RATIO: 32 — AB (ref 12–28)
BUN: 46 mg/dL — ABNORMAL HIGH (ref 8–27)
CHLORIDE: 87 mmol/L — AB (ref 96–106)
CO2: 28 mmol/L (ref 20–29)
Calcium: 10.3 mg/dL (ref 8.7–10.3)
Creatinine, Ser: 1.45 mg/dL — ABNORMAL HIGH (ref 0.57–1.00)
GFR calc Af Amer: 45 mL/min/{1.73_m2} — ABNORMAL LOW (ref >59)
GFR calc non Af Amer: 39 mL/min/{1.73_m2} — ABNORMAL LOW (ref >59)
GLUCOSE: 109 mg/dL — AB (ref 65–99)
POTASSIUM: 5.9 mmol/L — AB (ref 3.5–5.2)
SODIUM: 133 mmol/L — AB (ref 134–144)

## 2017-03-06 NOTE — Patient Instructions (Addendum)
Medication Instructions:  Your physician recommends that you continue on your current medications as directed. Please refer to the Current Medication list given to you today.  Labwork: TODAY:  BMET  Testing/Procedures: None ordered  Follow-Up: Your physician recommends that you schedule a follow-up appointment in: 3 MONTHS WITH DR. Meda Coffee   Any Other Special Instructions Will Be Listed Below (If Applicable).     If you need a refill on your cardiac medications before your next appointment, please call your pharmacy.              For patients with congestive heart failure, we give them these special instructions:  1. Follow a low-salt diet - you are allowed no more than 2,000mg  of sodium per day. Watch your fluid intake. In general, you should not be taking in more than 2 liters of fluid per day (no more than 8 glasses per day). This includes sources of water in foods like soup, coffee, tea, milk, etc. 2. Weigh yourself on the same scale at same time of day and keep a log. 3. Call your doctor: (Anytime you feel any of the following symptoms)  - 3lb weight gain overnight or 5lb within a few days - Shortness of breath, with or without a dry hacking cough  - Swelling in the hands, feet or stomach  - If you have to sleep on extra pillows at night in order to breathe   IT IS IMPORTANT TO LET YOUR DOCTOR KNOW EARLY ON IF YOU ARE HAVING SYMPTOMS SO WE CAN HELP YOU!

## 2017-03-09 ENCOUNTER — Telehealth: Payer: Self-pay | Admitting: *Deleted

## 2017-03-09 ENCOUNTER — Other Ambulatory Visit: Payer: Self-pay | Admitting: Physician Assistant

## 2017-03-09 ENCOUNTER — Other Ambulatory Visit: Payer: Medicare HMO | Admitting: *Deleted

## 2017-03-09 DIAGNOSIS — E875 Hyperkalemia: Secondary | ICD-10-CM

## 2017-03-09 DIAGNOSIS — Z79899 Other long term (current) drug therapy: Secondary | ICD-10-CM

## 2017-03-09 LAB — BASIC METABOLIC PANEL
BUN / CREAT RATIO: 30 — AB (ref 12–28)
BUN: 43 mg/dL — ABNORMAL HIGH (ref 8–27)
CHLORIDE: 86 mmol/L — AB (ref 96–106)
CO2: 29 mmol/L (ref 20–29)
Calcium: 10.1 mg/dL (ref 8.7–10.3)
Creatinine, Ser: 1.45 mg/dL — ABNORMAL HIGH (ref 0.57–1.00)
GFR calc Af Amer: 45 mL/min/{1.73_m2} — ABNORMAL LOW (ref >59)
GFR calc non Af Amer: 39 mL/min/{1.73_m2} — ABNORMAL LOW (ref >59)
Glucose: 136 mg/dL — ABNORMAL HIGH (ref 65–99)
POTASSIUM: 4.3 mmol/L (ref 3.5–5.2)
Sodium: 132 mmol/L — ABNORMAL LOW (ref 134–144)

## 2017-03-09 NOTE — Telephone Encounter (Signed)
-----   Message from Charlie Pitter, [place] sent at 03/09/2017  3:52 PM EDT ----- Pleases see result note on prior BMET. Creatinine on [facility] labs 3 days ago showed Cr of 1.9 and K of 5.4. Please let patient know these labs look better but creatinine is still abnormal. Potassium however has improved. Would not resume Lisinopril at this time. Avoid any potassium supplements (can be hidden in things like Ms. Dash as well). Would avoid any further metolazone for now - going forward we may be able to use this SPARINGLY as needed but the longterm plan will need to be dietary changes and weight reduction so that we do not have to use such strong medicines to take off the edema.  OK to resume Lasix tomorrow afternoon. Recheck BMET 1 week.  Dayna Dunn PA-C

## 2017-03-09 NOTE — Telephone Encounter (Signed)
-----   Message from Charlie Pitter, [place] sent at 03/09/2017  9:41 AM EDT ----- Actually, edited to add -- hold Lasix until repeat BMET Monday, encourage non-caffeinated oral fluid intake (up to 64oz per day). Dayna Dunn PA-C

## 2017-03-12 ENCOUNTER — Other Ambulatory Visit: Payer: Medicare HMO

## 2017-03-16 ENCOUNTER — Encounter (INDEPENDENT_AMBULATORY_CARE_PROVIDER_SITE_OTHER): Payer: Self-pay

## 2017-03-16 ENCOUNTER — Other Ambulatory Visit: Payer: Medicare HMO | Admitting: *Deleted

## 2017-03-16 DIAGNOSIS — Z79899 Other long term (current) drug therapy: Secondary | ICD-10-CM

## 2017-03-16 LAB — BASIC METABOLIC PANEL
BUN/Creatinine Ratio: 25 (ref 12–28)
BUN: 34 mg/dL — AB (ref 8–27)
CALCIUM: 9.9 mg/dL (ref 8.7–10.3)
CHLORIDE: 87 mmol/L — AB (ref 96–106)
CO2: 31 mmol/L — AB (ref 20–29)
Creatinine, Ser: 1.37 mg/dL — ABNORMAL HIGH (ref 0.57–1.00)
GFR calc non Af Amer: 42 mL/min/{1.73_m2} — ABNORMAL LOW (ref >59)
GFR, EST AFRICAN AMERICAN: 48 mL/min/{1.73_m2} — AB (ref >59)
Glucose: 111 mg/dL — ABNORMAL HIGH (ref 65–99)
POTASSIUM: 5 mmol/L (ref 3.5–5.2)
Sodium: 132 mmol/L — ABNORMAL LOW (ref 134–144)

## 2017-03-19 ENCOUNTER — Telehealth: Payer: Self-pay | Admitting: *Deleted

## 2017-03-19 NOTE — Telephone Encounter (Signed)
-----   Message from Charlie Pitter, [place] sent at 03/19/2017  8:35 AM EDT ----- Please let patient know labs appear stable/similar to prior. Potassium remains upper limit of normal so recommend to limit potassium intake in diet. Dayna Dunn PA-C

## 2017-03-19 NOTE — Telephone Encounter (Signed)
Tried to reach pt to go over lab results and recommendations, though no answer, no vm.

## 2017-03-22 ENCOUNTER — Other Ambulatory Visit: Payer: Self-pay | Admitting: Cardiology

## 2017-06-06 ENCOUNTER — Ambulatory Visit (INDEPENDENT_AMBULATORY_CARE_PROVIDER_SITE_OTHER): Payer: Medicare HMO | Admitting: Cardiology

## 2017-06-06 ENCOUNTER — Encounter (INDEPENDENT_AMBULATORY_CARE_PROVIDER_SITE_OTHER): Payer: Self-pay

## 2017-06-06 ENCOUNTER — Encounter: Payer: Self-pay | Admitting: Cardiology

## 2017-06-06 VITALS — BP 124/74 | HR 87 | Ht 66.0 in | Wt 355.0 lb

## 2017-06-06 DIAGNOSIS — E785 Hyperlipidemia, unspecified: Secondary | ICD-10-CM | POA: Diagnosis not present

## 2017-06-06 DIAGNOSIS — I5033 Acute on chronic diastolic (congestive) heart failure: Secondary | ICD-10-CM

## 2017-06-06 DIAGNOSIS — I10 Essential (primary) hypertension: Secondary | ICD-10-CM | POA: Diagnosis not present

## 2017-06-06 DIAGNOSIS — Z79899 Other long term (current) drug therapy: Secondary | ICD-10-CM | POA: Diagnosis not present

## 2017-06-06 DIAGNOSIS — J9611 Chronic respiratory failure with hypoxia: Secondary | ICD-10-CM | POA: Diagnosis not present

## 2017-06-06 LAB — BASIC METABOLIC PANEL
BUN/Creatinine Ratio: 22 (ref 12–28)
BUN: 26 mg/dL (ref 8–27)
CO2: 28 mmol/L (ref 20–29)
Calcium: 9.4 mg/dL (ref 8.7–10.3)
Chloride: 94 mmol/L — ABNORMAL LOW (ref 96–106)
Creatinine, Ser: 1.17 mg/dL — ABNORMAL HIGH (ref 0.57–1.00)
GFR calc Af Amer: 59 mL/min/{1.73_m2} — ABNORMAL LOW (ref >59)
GFR calc non Af Amer: 51 mL/min/{1.73_m2} — ABNORMAL LOW (ref >59)
Glucose: 100 mg/dL — ABNORMAL HIGH (ref 65–99)
Potassium: 5.1 mmol/L (ref 3.5–5.2)
Sodium: 136 mmol/L (ref 134–144)

## 2017-06-06 LAB — PRO B NATRIURETIC PEPTIDE: NT-Pro BNP: 529 pg/mL — ABNORMAL HIGH (ref 0–287)

## 2017-06-06 MED ORDER — METOLAZONE 2.5 MG PO TABS: ORAL_TABLET | ORAL | 3 refills | Status: DC

## 2017-06-06 NOTE — Patient Instructions (Addendum)
Medication Instructions:  Your physician has recommended you make the following change in your medication: 1. START Metolazone 2.5 mg -- take 1 tablet on Tuesday, Thursday & Saturday  -- If you need a refill on your cardiac medications before your next appointment, please call your pharmacy. --  Labwork: Today: BMET & BNP   Testing/Procedures: None ordered  Follow-Up: Your physician recommends that you schedule a follow-up appointment in: 4 weeks with Melina Copa, PA or Dr. Meda Coffee.  Thank you for choosing [facility]!!     Any Other Special Instructions Will Be Listed Below (If Applicable).  Metolazone tablets What is this medicine? METOLAZONE (me TOLE a zone) is a diuretic. It increases the amount of urine passed, which causes the body to lose salt and water. This medicine is used to treat high blood pressure. It is also reduces the swelling and water retention caused by heart or kidney disease. This medicine may be used for other purposes; ask your health care provider or pharmacist if you have questions. COMMON BRAND NAME(S): Mykrox, Zaroxolyn What should I tell my health care provider before I take this medicine? They need to know if you have any of these conditions: -diabetes -gout -immune system problems, like lupus -kidney disease -liver disease -pancreatitis -small amount of urine or difficulty passing urine -an unusual or allergic reaction to metolazone, sulfa drugs, other medicines, foods, dyes, or preservatives -pregnant or trying to get pregnant -breast-feeding How should I use this medicine? Take this medicine by mouth with a glass of water. Follow the directions on the prescription label. Remember that you will need to pass urine frequently after taking this medicine. Do not take your doses at a time of day that will cause you problems. Do not take at bedtime. Take your medicine at regular intervals. Do not take your medicine more often than directed. Do not  stop taking except on your doctor's advice. Talk to your pediatrician regarding the use of this medicine in children. Special care may be needed. Overdosage: If you think you have taken too much of this medicine contact a poison control center or emergency room at once. NOTE: This medicine is only for you. Do not share this medicine with others. What if I miss a dose? If you miss a dose, take it as soon as you can. If it is almost time for your next dose, take only that dose. Do not take double or extra doses. What may interact with this medicine? -alcohol -antiinflammatory drugs for pain or swelling -barbiturates for sleep or seizure control -digoxin -dofetilide -lithium -medicines for blood sugar -medicines for high blood pressure -medicines that relax muscles for surgery -methenamine -other diuretics -some medicines for pain -steroid hormones like cortisone, hydrocortisone, and prednisone -warfarin This list may not describe all possible interactions. Give your health care provider a list of all the medicines, herbs, non-prescription drugs, or dietary supplements you use. Also tell them if you smoke, drink alcohol, or use illegal drugs. Some items may interact with your medicine. What should I watch for while using this medicine? Visit your doctor or health care professional for regular checks on your progress. Check your blood pressure as directed. Ask your doctor or health care professional what your blood pressure should be and when you should contact him or her. You may need to be on a special diet while taking this medicine. Ask your doctor. Check with your doctor or health care professional if you get an attack of severe diarrhea, nausea and  vomiting, or if you sweat a lot. The loss of too much body fluid can make it dangerous for you to take this medicine. You may get drowsy or dizzy. Do not drive, use machinery, or do anything that needs mental alertness until you know how this  medicine affects you. Do not stand or sit up quickly, especially if you are an older patient. This reduces the risk of dizzy or fainting spells. Alcohol may interfere with the effect of this medicine. Avoid alcoholic drinks. This medicine may affect your blood sugar level. If you have diabetes, check with your doctor or health care professional before changing the dose of your diabetic medicine. This medicine can make you more sensitive to the sun. Keep out of the sun. If you cannot avoid being in the sun, wear protective clothing and use sunscreen. Do not use sun lamps or tanning beds/booths. What side effects may I notice from receiving this medicine? Side effects that you should report to your doctor or health care professional as soon as possible: -allergic reactions such as skin rash or itching, hives, swelling of the lips, mouth, tongue, or throat -fast or irregular heartbeat, chest pain -feeling faint -fever, chills -gout pain -hot red lump on leg -muscle pain, cramps -nausea, vomiting -numbness or tingling in hands, feet -pain or difficulty when passing urine -redness, blistering, peeling or loosening of the skin, including inside the mouth -unusual bleeding or bruising -unusually weak or tired -yellowing of the eyes, skin Side effects that usually do not require medical attention (report to your doctor or health care professional if they continue or are bothersome): -abdominal pain -blurred vision -constipation or diarrhea -dry mouth -headache This list may not describe all possible side effects. Call your doctor for medical advice about side effects. You may report side effects to FDA at 1-800-FDA-1088. Where should I keep my medicine? Keep out of the reach of children. Store at room temperature between 15 and 30 degrees C (59 and 86 degrees F). Protect from light. Keep container tightly closed. Throw away any unused medicine after the expiration date. NOTE: This sheet is a  summary. It may not cover all possible information. If you have questions about this medicine, talk to your doctor, pharmacist, or health care provider.  2018 Elsevier/Gold Standard (2008-03-09 14:11:48)

## 2017-06-06 NOTE — Progress Notes (Signed)
Cardiology Office Note    Date:  06/06/2017  ID:  [name] [name], DOB 01-11-1956, MRN [id number] PCP:  Curly Rim, MD  Cardiologist:  Dr. Meda Coffee   Chief Complaint: f/u CHF  History of Present Illness:  [name] [name] is a 61 y.o. 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 In 10/2016 she required admission for a/c CHF requiring IV Lasix and metolazone with 2D Echo 10/31/16 showing mild LVH, EF 60-65%, grade 2 DD, moderate TR. She diuresed from 347 to 327lb. More recently she was seen 02/09/17 by Dr. Meda Coffee at which time she had gained 18lb since April and was also felt to have cellulitis. Lasix was increased to 80mg  BID (was on 80mg  qam/40mg  qpm) and metolazone added 3x/week along with Keflex. Labs showed BUN 28, Cr 1.00, CO2 30, K 5.1, BNP 173 - prior Cr had run 1.3-1.5.  03/06/2017 - She returns back for follow-up feeling much better. Weight is back down to 321lb. She has been monitoring her sodium intake. Although she still has impressive edema on exam, she states that Dr. Meda Coffee would be shocked how good they look and how much they have improved. She does not have any weeping or nonhealing ulcers. She states PCP took her off lisinopril recently due to an ongoing cough ever since an episode of bronchitis - this is on hold for now. BP is well controlled. States she is unable to exercise due to knee issues.  06/06/2017 - 3 months follow up, we discontinues metolazone as she improved significantly and her Crea has increased 1.0-->1.45. She states that she wasn't good with her diet, eats a lot of potatoes, bread, chips. She has gained 35 lbs since July. She wears home O2. She has worsening LE lymphedema, increased abdominal girth, but  sleeps ok. No cough, fever, chills. Follow with pulmonary for COPD.   Past Medical History:  Diagnosis Date  . Bronchitis, mucopurulent recurrent ([place])   . Carpal tunnel syndrome    bilateral  . Chronic diastolic CHF (congestive heart failure) ([place])   . Chronic respiratory failure ([place])   . CKD (chronic kidney disease), stage III ([place])   . Diabetes mellitus ([place])   . Esophageal reflux   . Essential hypertension   . Former smoker   . Gout   . Lymphedema   . Morbid obesity ([place])   . Obesity hypoventilation syndrome ([place])   . On home oxygen therapy   . Osteoarthrosis, unspecified whether generalized or localized, unspecified site   . Other and unspecified hyperlipidemia   . Polyp of nasal cavity   . Vulva cancer ([place])    resected 2002   Past Surgical History:  Procedure Laterality Date  . CESAREAN SECTION    . COLONOSCOPY W/ POLYPECTOMY     negative polyps  . LYMPHADENECTOMY Bilateral    inguinal   . RADICAL VULVECTOMY    . TOTAL KNEE ARTHROPLASTY Right 01/2014   [facility] (Dr Rito Ehrlich)   Current Medications: Current Meds  Medication Sig  . albuterol (PROVENTIL HFA;VENTOLIN HFA) 108 (90 BASE) MCG/ACT inhaler INHALE 2 PUFFS INTO THE LUNGS EVERY 4 HOURS AS NEEDED FOR SHORTNESS OF BREATH  . albuterol (PROVENTIL) (2.5 MG/3ML) 0.083% nebulizer solution Use one unit dose in nebulizer every 4  hours as needed for wheezing  . allopurinol (ZYLOPRIM) 300 MG tablet Take 300 mg by mouth daily.  Marland Kitchen amitriptyline (ELAVIL) 10 MG tablet Take 10 mg by mouth at bedtime. On hold for allergy test  . amLODipine (NORVASC) 5 MG tablet Take 1 tablet (5 mg total) by mouth daily.  Marland Kitchen aspirin 81 MG chewable tablet Chew 81 mg by mouth daily.  . budesonide-formoterol (SYMBICORT) 160-4.5 MCG/ACT inhaler Inhale 2 puffs into the lungs 2 (two) times daily.  . furosemide (LASIX) 80 MG tablet Take 1 tablet (80 mg total) by mouth 2 (two) times daily.  . montelukast (SINGULAIR) 10 MG tablet Take 10 mg  by mouth at bedtime.  Marland Kitchen oxybutynin (DITROPAN-XL) 10 MG 24 hr tablet Take 1 tablet by mouth daily.  . pantoprazole (PROTONIX) 40 MG tablet Take 40 mg by mouth daily.  . pravastatin (PRAVACHOL) 20 MG tablet Take 20 mg by mouth daily.  . sitaGLIPtin (JANUVIA) 50 MG tablet Take 50 mg by mouth daily.    Allergies:   Penicillins and Sulfa antibiotics   Social History   Social History  . Marital status: Married    Spouse name: N/A  . Number of children: 1  . Years of education: N/A   Occupational History  . retired    Social History Main Topics  . Smoking status: Former Smoker    Packs/day: 1.50    Years: 25.00    Types: Cigarettes    Quit date: 09/05/1999  . Smokeless tobacco: Never Used  . Alcohol use Yes  . Drug use: No  . Sexual activity: Not Asked   Other Topics Concern  . None   Social History Narrative   Disabled due to obesity and osteoarthritis    Family History:  Family History  Problem Relation Age of Onset  . Congestive Heart Failure Mother   . Kidney failure Mother   . Stroke Father   . Diabetes type II Father   . Hypertension Sister   . Heart attack Brother   . Healthy Sister   . Heart disease Brother   . COPD Brother   . Healthy Brother   . Healthy Brother   . Hypertension Unknown        family history  . Rheum arthritis Unknown        family history  . Stroke Unknown        family history    ROS:   Please see the history of present illness. All other systems are reviewed and otherwise negative.   PHYSICAL EXAM:   VS:  BP 124/74   Pulse 87   Ht 5\' 6"  (1.676 m)   Wt (!) 355 lb (161 kg)   SpO2 95% Comment: 83% after walking to room from lounge. 95% after 5 mins.  BMI 57.30 kg/m   BMI: Body mass index is 57.3 kg/m. GEN: Well nourished, well developed morbidly obese WF, in no acute distress  HEENT: normocephalic, atraumatic Neck: no JVD, carotid bruits, or masses Cardiac: RRR; no murmurs, rubs, or gallops, 4+ bilateral LE edema superimposed on  large leg habitus with chronic skin thickening changes and chronic lymphedema Respiratory:  clear to auscultation bilaterally, normal work of breathing GI: soft, nontender, nondistended, + BS MS: no deformity or atrophy  Skin: warm and dry, no rash Neuro:  Alert and Oriented x 3, Strength and sensation are intact, follows commands Psych: euthymic mood, full affect  Wt Readings from Last 3 Encounters:  06/06/17 (!) 355 lb (161 kg)  03/06/17 (!) 321 lb 12.8 oz (146 kg)  02/09/17 (!) 340 lb (154.2 kg)    Studies/Labs Reviewed:   EKG:  EKG was ordered today 06/06/2017 and it shows SR, PACs, unchanged from prior, personally reviewed..  Recent Labs: 10/30/2016: ALT 19; B Natriuretic Peptide 163.9; Hemoglobin 10.4; Magnesium 1.9; Platelets 282; TSH 1.507 02/09/2017: NT-Pro BNP 173 03/16/2017: BUN 34; Creatinine, Ser 1.37; Potassium 5.0; Sodium 132   Lipid Panel    Component Value Date/Time   CHOL 192 06/21/2016 0959   TRIG 128 06/21/2016 0959   HDL 47 06/21/2016 0959   CHOLHDL 4.1 06/21/2016 0959   VLDL 26 06/21/2016 0959   LDLCALC 119 06/21/2016 0959    Additional studies/ records that were reviewed today include: Summarized above    ASSESSMENT & PLAN:   1. Acute on chronic diastolic CHF - she has gained 35 lbs in 3 months, I will restart metolazone 3x/week - Tu, Th, Sa. Follow up in 4 weeks, I will check BMP and BNP.   2. Morbid obesity - reviewed my concerns about her overall prognosis if she remains morbidly obese. I have discussed dietary changes necessary for weight but also CHF management.  3. CKD stage III - recheck with BMET today. 4. Essential HTN - well controlled 5. Hyperlipidemia - tolerates pravastatin well.   Disposition: F/u with Dr. Meda Coffee in 4 weeks.  Medication Adjustments/Labs and Tests Ordered: Current medicines are reviewed at length with the patient today.  Concerns regarding medicines are outlined above. Medication changes, Labs and Tests ordered today  are summarized above and listed in the Patient Instructions accessible in Encounters.   Signed, Ena Dawley, MD  06/06/2017 8:35 AM    [address], [place], [place]  [address] Phone: [phone]; Fax: [phone]

## 2017-07-03 NOTE — Progress Notes (Signed)
Cardiology Office Note    Date:  07/05/2017  ID:  KAMEO BAINS, DOB [birth year], MRN [id number] PCP:  Curly Rim, MD  Cardiologist:  Dr. Meda Coffee   Chief Complaint: f/u CHF  History of Present Illness:  [name] [name] is a 61 y.o. 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 In 10/2016 she required admission for a/c CHF requiring IV Lasix and metolazone with 2D Echo 10/31/16 showing mild LVH, EF 60-65%, grade 2 DD, moderate TR. She diuresed from 347 to 327lb. She required OP diuresis in June 2018. Lisinopril was previously stopped due to cough. She's been unable to exercise due to knee issues. She was seen 06/06/17 in the setting of dietary noncompliance and 35lb weight gain in 3 months. Dr. Meda Coffee recommended to restart metolazone 3x a week. Last labs showed BNP 529, Cr 1.17, K 5.1. Last Hgb 10.4 in 10/2016, previously 11.8 in 2015.  She returns for follow-up feeling much better. Weight went from 355 down to 330 (previously got down as low as 321 in 03/2017). Dry weight appears to be a moving target as she has been anywhere from 320 to 360 the last 4 years. She declines referral to [facility] obesity clinic. She is not currently tracking her dietary intake as we previously discussed. She feels her chronic edema is back near to baseline. No CP, SOB, bleeding.   Past Medical History:  Diagnosis Date  . Bronchitis, mucopurulent recurrent ([place])   . Carpal tunnel syndrome    bilateral  . Chronic diastolic CHF (congestive heart failure) ([place])   . Chronic respiratory failure ([place])   . CKD (chronic kidney disease), stage III ([place])   . Diabetes mellitus ([place])   . Esophageal reflux   . Essential hypertension   . Former smoker   . Gout     . Lymphedema   . Morbid obesity ([place])   . Obesity hypoventilation syndrome ([place])   . On home oxygen therapy   . Osteoarthrosis, unspecified whether generalized or localized, unspecified site   . Other and unspecified hyperlipidemia   . Polyp of nasal cavity   . Vulva cancer ([place])    resected 2002    Past Surgical History:  Procedure Laterality Date  . CESAREAN SECTION    . COLONOSCOPY W/ POLYPECTOMY     negative polyps  . LYMPHADENECTOMY Bilateral    inguinal   . RADICAL VULVECTOMY    . TOTAL KNEE ARTHROPLASTY Right 01/2014   [facility] (Dr Rito Ehrlich)    Current Medications: Current Meds  Medication Sig  . albuterol (PROVENTIL HFA;VENTOLIN HFA) 108 (90 BASE) MCG/ACT inhaler INHALE 2 PUFFS INTO THE LUNGS EVERY 4 HOURS AS NEEDED FOR SHORTNESS OF BREATH  . albuterol (PROVENTIL) (2.5 MG/3ML) 0.083% nebulizer solution Use one unit dose in nebulizer every 4 hours as needed for wheezing  . allopurinol (ZYLOPRIM) 300 MG tablet Take 300 mg by mouth daily.  Marland Kitchen amitriptyline (ELAVIL) 10 MG tablet Take 10 mg by mouth at bedtime. On hold for allergy test  . amLODipine (NORVASC) 5 MG tablet Take 1 tablet (5 mg total) by mouth daily.  Marland Kitchen aspirin 81 MG chewable tablet Chew 81 mg by mouth daily.  . budesonide-formoterol (SYMBICORT) 160-4.5 MCG/ACT inhaler Inhale 2 puffs into the lungs  2 (two) times daily.  . furosemide (LASIX) 80 MG tablet Take 1 tablet (80 mg total) by mouth 2 (two) times daily.  . metolazone (ZAROXOLYN) 2.5 MG tablet Take 1 tablet (2.5 mg total) by mouth on Tuesday, Thursday and Saturday.  . montelukast (SINGULAIR) 10 MG tablet Take 10 mg by mouth at bedtime.  Marland Kitchen oxybutynin (DITROPAN-XL) 10 MG 24 hr tablet Take 1 tablet by mouth daily.  . pantoprazole (PROTONIX) 40 MG tablet Take 40 mg by mouth daily.  . pravastatin (PRAVACHOL) 20 MG tablet Take 20 mg by mouth daily.  . sitaGLIPtin (JANUVIA) 50 MG tablet Take 50 mg by mouth daily.     Allergies:   Penicillins and  Sulfa antibiotics   Social History   Social History  . Marital status: Married    Spouse name: N/A  . Number of children: 1  . Years of education: N/A   Occupational History  . retired    Social History Main Topics  . Smoking status: Former Smoker    Packs/day: 1.50    Years: 25.00    Types: Cigarettes    Quit date: 09/05/1999  . Smokeless tobacco: Never Used  . Alcohol use Yes  . Drug use: No  . Sexual activity: Not Asked   Other Topics Concern  . None   Social History Narrative   Disabled due to obesity and osteoarthritis     Family History:  Family History  Problem Relation Age of Onset  . Congestive Heart Failure Mother   . Kidney failure Mother   . Stroke Father   . Diabetes type II Father   . Hypertension Sister   . Heart attack Brother   . Healthy Sister   . Heart disease Brother   . COPD Brother   . Healthy Brother   . Healthy Brother   . Hypertension Unknown        family history  . Rheum arthritis Unknown        family history  . Stroke Unknown        family history    ROS:   Please see the history of present illness.  All other systems are reviewed and otherwise negative.    PHYSICAL EXAM:   VS:  BP 130/66   Pulse 94   Ht 5\' 6"  (1.676 m)   Wt (!) 330 lb 6.4 oz (149.9 kg)   SpO2 92%   BMI 53.33 kg/m   BMI: Body mass index is 53.33 kg/m. GEN: Well nourished, well developed morbidly obese WF in no acute distress  HEENT: normocephalic, atraumatic Neck: no JVD, carotid bruits, or masses Cardiac: RRR; no murmurs, rubs, or gallops, 2+ bilateral LE edema superimposed on large baseline leg habitus with chronic skin thickening changes Respiratory:  clear to auscultation bilaterally, normal work of breathing GI: soft, nontender, nondistended, + BS MS: no deformity or atrophy  Skin: warm and dry, no rash Neuro:  Alert and Oriented x 3, Strength and sensation are intact, follows commands Psych: euthymic mood, full affect  Wt Readings from Last  3 Encounters:  07/05/17 (!) 330 lb 6.4 oz (149.9 kg)  06/06/17 (!) 355 lb (161 kg)  03/06/17 (!) 321 lb 12.8 oz (146 kg)      Studies/Labs Reviewed:   EKG:   EKG was not ordered today.  Recent Labs: 10/30/2016: ALT 19; B Natriuretic Peptide 163.9; Hemoglobin 10.4; Magnesium 1.9; Platelets 282; TSH 1.507 06/06/2017: BUN 26; Creatinine, Ser 1.17; NT-Pro BNP 529; Potassium 5.1; Sodium 136  Lipid Panel    Component Value Date/Time   CHOL 192 06/21/2016 0959   TRIG 128 06/21/2016 0959   HDL 47 06/21/2016 0959   CHOLHDL 4.1 06/21/2016 0959   VLDL 26 06/21/2016 0959   LDLCALC 119 06/21/2016 0959    Additional studies/ records that were reviewed today include: Summarized above.    ASSESSMENT & PLAN:   1. Chronic diastolic CHF - she reports significant improvement with resumption of metolazone. Weight is still not yet back to where she was in 03/2017 but I suspect some of the recent gain was body weight gain as she states she was not really watching her diet at all this summer. She feels her edema status has returned to baseline (chronic lymphedema). Will check BMET and BNP today to reassess stability and help guide further directions for metolazone. Need to be cautious with this medication longterm given potential for electrolyte disturbances and kidney function abnormalities. I also discussed utmost importance of daily weights and notifying our office long before she's gained 20-30lbs. I advised she follow strict instructions to call if weight goes up 3 lb overnight or 5 lb in a day. Also reviewed information on sodium and fluid restriction, and elevation of legs. 2. Morbid obesity - revisited the importance of this continuing to pose a risk to her health. I'm not sure she's motivated for change at this time. 3. CKD stage III - followed by nephrology per Epic. F/u Cr today. She brought in an rx for Mobic, inquiring about safety of this. Given her history of kidney issues I've asked her to  hold off and discuss alternatives with the prescriber. 4. Essential HTN - controlled. 5. Diabetes mellitus with nephropathy - she requests we obtain f/u A1C today to send to her PCP. 6. Anemia - prior Hgb in 10/2016 was incidentally noted as low. Will f/u today.  Disposition: F/u with Dr. Meda Coffee in 3 months. I initially requested sooner follow-up but the patient specifically requests no sooner than 3 months. Again, reiterated importance of notifying our office of any progressive symptoms or weight gain.   Medication Adjustments/Labs and Tests Ordered: Current medicines are reviewed at length with the patient today.  Concerns regarding medicines are outlined above. Medication changes, Labs and Tests ordered today are summarized above and listed in the Patient Instructions accessible in Encounters.   Signed, [name] Pitter, PA-C  07/05/2017 9:50 AM    [address], [place], [place]  [address] Phone: [phone]; Fax: [phone]

## 2017-07-05 ENCOUNTER — Encounter: Payer: Self-pay | Admitting: Physician Assistant

## 2017-07-05 ENCOUNTER — Ambulatory Visit (INDEPENDENT_AMBULATORY_CARE_PROVIDER_SITE_OTHER): Payer: Medicare HMO | Admitting: Physician Assistant

## 2017-07-05 VITALS — BP 130/66 | HR 94 | Ht 66.0 in | Wt 330.4 lb

## 2017-07-05 DIAGNOSIS — N183 Chronic kidney disease, stage 3 unspecified: Secondary | ICD-10-CM

## 2017-07-05 DIAGNOSIS — E1121 Type 2 diabetes mellitus with diabetic nephropathy: Secondary | ICD-10-CM | POA: Diagnosis not present

## 2017-07-05 DIAGNOSIS — I10 Essential (primary) hypertension: Secondary | ICD-10-CM | POA: Diagnosis not present

## 2017-07-05 DIAGNOSIS — I5032 Chronic diastolic (congestive) heart failure: Secondary | ICD-10-CM | POA: Diagnosis not present

## 2017-07-05 DIAGNOSIS — D649 Anemia, unspecified: Secondary | ICD-10-CM | POA: Diagnosis not present

## 2017-07-05 NOTE — Patient Instructions (Signed)
Medication Instructions:  Your physician recommends that you continue on your current medications as directed. Please refer to the Current Medication list given to you today.   Labwork: TODAY:  HgbA1C, BMET, CBC, & BNP  Testing/Procedures: None ordered  Follow-Up: Your physician recommends that you schedule a follow-up appointment in: 3 [place]    Any Other Special Instructions Will Be Listed Below (If Applicable).     If you need a refill on your cardiac medications before your next appointment, please call your pharmacy.

## 2017-07-06 LAB — CBC
HEMOGLOBIN: 9.1 g/dL — AB (ref 11.1–15.9)
Hematocrit: 30.6 % — ABNORMAL LOW (ref 34.0–46.6)
MCH: 20.9 pg — AB (ref 26.6–33.0)
MCHC: 29.7 g/dL — AB (ref 31.5–35.7)
MCV: 70 fL — AB (ref 79–97)
PLATELETS: 372 10*3/uL (ref 150–379)
RBC: 4.36 x10E6/uL (ref 3.77–5.28)
RDW: 16.7 % — ABNORMAL HIGH (ref 12.3–15.4)
WBC: 7.4 10*3/uL (ref 3.4–10.8)

## 2017-07-06 LAB — BASIC METABOLIC PANEL
BUN / CREAT RATIO: 28 (ref 12–28)
BUN: 44 mg/dL — ABNORMAL HIGH (ref 8–27)
CALCIUM: 9.5 mg/dL (ref 8.7–10.3)
CO2: 31 mmol/L — ABNORMAL HIGH (ref 20–29)
Chloride: 86 mmol/L — ABNORMAL LOW (ref 96–106)
Creatinine, Ser: 1.58 mg/dL — ABNORMAL HIGH (ref 0.57–1.00)
GFR, EST AFRICAN AMERICAN: 41 mL/min/{1.73_m2} — AB (ref >59)
GFR, EST NON AFRICAN AMERICAN: 35 mL/min/{1.73_m2} — AB (ref >59)
Glucose: 119 mg/dL — ABNORMAL HIGH (ref 65–99)
POTASSIUM: 4.1 mmol/L (ref 3.5–5.2)
Sodium: 134 mmol/L (ref 134–144)

## 2017-07-06 LAB — HEMOGLOBIN A1C
Est. average glucose Bld gHb Est-mCnc: 148 mg/dL
HEMOGLOBIN A1C: 6.8 % — AB (ref 4.8–5.6)

## 2017-07-06 LAB — PRO B NATRIURETIC PEPTIDE: NT-Pro BNP: 242 pg/mL (ref 0–287)

## 2017-08-25 ENCOUNTER — Telehealth: Payer: Self-pay | Admitting: Physician Assistant

## 2017-08-25 ENCOUNTER — Other Ambulatory Visit: Payer: Self-pay | Admitting: Physician Assistant

## 2017-08-25 MED ORDER — FUROSEMIDE 80 MG PO TABS: 80.0000 mg | ORAL_TABLET | Freq: Two times a day (BID) | ORAL | 1 refills | Status: AC

## 2017-08-25 MED ORDER — FUROSEMIDE 80 MG PO TABS: 80.0000 mg | ORAL_TABLET | Freq: Two times a day (BID) | ORAL | 1 refills | Status: DC

## 2017-08-25 NOTE — Telephone Encounter (Signed)
Patient was completely out of furosemide, needs prescription sent to both mail order and local pharmacy so she does not go without.  Prescriptions sent into both places.  No other issues or concerns.  Rosaria Ferries, PA-C 08/25/2017 8:56 AM Bee[facility] [phone]

## 2017-10-02 ENCOUNTER — Other Ambulatory Visit: Payer: Self-pay | Admitting: Cardiology

## 2017-10-15 ENCOUNTER — Ambulatory Visit: Payer: Medicare HMO | Admitting: Cardiology

## 2017-10-22 ENCOUNTER — Telehealth: Payer: Self-pay | Admitting: Cardiology

## 2017-10-22 NOTE — Telephone Encounter (Signed)
Spoke with the [name] [name] and informed her that Dr Meda Coffee agreed with recommendations provided. [name] [name] verbalized understanding and agrees with this plan.

## 2017-10-22 NOTE — Telephone Encounter (Signed)
New message    Pt c/o swelling: STAT is pt has developed SOB within 24 hours  1) How much weight have you gained and in what time span? n/a  2) If swelling, where is the swelling located? Legs and feet  3) Are you currently taking a fluid pill? yes  4) Are you currently SOB? Yes,  comes and goes when moving around, wears 2l o2  5) Do you have a log of your daily weights (if so, list)? no  6) Have you gained 3 pounds in a day or 5 pounds in a week? n/a  7) Have you traveled recently? no

## 2017-10-22 NOTE — Telephone Encounter (Signed)
Pt calling to report to Dr Meda Coffee that she was placed on oral steroids for bronchitis infection, over 2 weeks ago.  Pt states that she had increased sob and LEE while on steroids.  Pt states she also stopped taking her metolazone, by choice, while being on steroids. Pt states she stopped her steroids, because of it causing worsening lower extremity edema, and she will resume back taking her metolazone, 3 times weekly, taking it 30 mins prior to his morning dose of lasix.  Pt also scheduled an appt with Dr Meda Coffee for next Monday 2/25 at 0900. Pt refused to see a PA-C this week, for she states she wants to see only Dr Meda Coffee.  Advised the pt to resume back her metolazone and lasix, and maintain a low sodium diet.  Advised the pt to elevate her extremities while sitting.  Advised the pt that if her symptoms worsen she should refer to the ER.  Advised the pt that we will see her as planned for next week.  Advised the pt to do dry weights in the morning and report critical values. Informed the pt that I will route this message to Dr Meda Coffee for further review and recommendation as needed, and follow-up with her accordingly.  Pt verbalized understanding and agrees with this plan.

## 2017-10-22 NOTE — Telephone Encounter (Signed)
I agree with your recomemdations

## 2017-10-29 ENCOUNTER — Encounter: Payer: Self-pay | Admitting: Cardiology

## 2017-10-29 ENCOUNTER — Other Ambulatory Visit: Payer: Self-pay | Admitting: *Deleted

## 2017-10-29 ENCOUNTER — Ambulatory Visit (INDEPENDENT_AMBULATORY_CARE_PROVIDER_SITE_OTHER): Payer: Medicare HMO | Admitting: Cardiology

## 2017-10-29 VITALS — BP 124/74 | HR 97 | Ht 66.0 in | Wt 329.0 lb

## 2017-10-29 DIAGNOSIS — J209 Acute bronchitis, unspecified: Secondary | ICD-10-CM | POA: Diagnosis not present

## 2017-10-29 DIAGNOSIS — N183 Chronic kidney disease, stage 3 unspecified: Secondary | ICD-10-CM

## 2017-10-29 DIAGNOSIS — I10 Essential (primary) hypertension: Secondary | ICD-10-CM | POA: Diagnosis not present

## 2017-10-29 DIAGNOSIS — I5033 Acute on chronic diastolic (congestive) heart failure: Secondary | ICD-10-CM

## 2017-10-29 LAB — CBC
Hematocrit: 26.7 % — ABNORMAL LOW (ref 34.0–46.6)
Hemoglobin: 7.9 g/dL — ABNORMAL LOW (ref 11.1–15.9)
MCH: 19.4 pg — ABNORMAL LOW (ref 26.6–33.0)
MCHC: 29.6 g/dL — ABNORMAL LOW (ref 31.5–35.7)
MCV: 65 fL — ABNORMAL LOW (ref 79–97)
Platelets: 521 10*3/uL — ABNORMAL HIGH (ref 150–379)
RBC: 4.08 x10E6/uL (ref 3.77–5.28)
RDW: 17.8 % — ABNORMAL HIGH (ref 12.3–15.4)
WBC: 8.4 10*3/uL (ref 3.4–10.8)

## 2017-10-29 LAB — BASIC METABOLIC PANEL
BUN/Creatinine Ratio: 24 (ref 12–28)
BUN: 34 mg/dL — ABNORMAL HIGH (ref 8–27)
CO2: 31 mmol/L — ABNORMAL HIGH (ref 20–29)
Calcium: 9.1 mg/dL (ref 8.7–10.3)
Chloride: 83 mmol/L — ABNORMAL LOW (ref 96–106)
Creatinine, Ser: 1.44 mg/dL — ABNORMAL HIGH (ref 0.57–1.00)
GFR calc Af Amer: 45 mL/min/{1.73_m2} — ABNORMAL LOW (ref >59)
GFR calc non Af Amer: 39 mL/min/{1.73_m2} — ABNORMAL LOW (ref >59)
Glucose: 122 mg/dL — ABNORMAL HIGH (ref 65–99)
Potassium: 4.3 mmol/L (ref 3.5–5.2)
Sodium: 131 mmol/L — ABNORMAL LOW (ref 134–144)

## 2017-10-29 LAB — PRO B NATRIURETIC PEPTIDE: NT-Pro BNP: 1069 pg/mL — ABNORMAL HIGH (ref 0–287)

## 2017-10-29 MED ORDER — METOLAZONE 2.5 MG PO TABS: 2.5000 mg | ORAL_TABLET | Freq: Every day | ORAL | 3 refills | Status: AC

## 2017-10-29 NOTE — Progress Notes (Signed)
Cardiology Office Note    Date:  10/29/2017  ID:  [name] [name], [name] [name] Sep 23, 1955, MRN [id number] PCP:  Curly Rim, MD  Cardiologist:  Dr. Meda Coffee   Chief Complaint: 3 months follow-up  History of Present Illness:  [name] [name] is a 62 y.o. 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 In 10/2016 she required admission for a/c CHF requiring IV Lasix and metolazone with 2D Echo 10/31/16 showing mild LVH, EF 60-65%, grade 2 DD, moderate TR. She diuresed from 347 to 327lb. She required OP diuresis in June 2018. Lisinopril was previously stopped due to cough. She's been unable to exercise due to knee issues. She was seen 06/06/17 in the setting of dietary noncompliance and 35lb weight gain in 3 months. Dr. Meda Coffee recommended to restart metolazone 3x a week. Last labs showed BNP 529, Cr 1.17, K 5.1. Last Hgb 10.4 in 10/2016, previously 11.8 in 2015.  She returns for follow-up feeling much better. Weight went from 355 down to 330 (previously got down as low as 321 in 03/2017). Dry weight appears to be a moving target as she has been anywhere from 320 to 360 the last 4 years. She declines referral to [facility] obesity clinic. She is not currently tracking her dietary intake as we previously discussed. She feels her chronic edema is back near to baseline. No CP, SOB, bleeding.  10/29/2017 the patient is coming after 3 months, she has been struggling with recurrent bronchitis in the last months, she has received antibiotics and steroids, that lead to weight gain so she stopped. She has stopped wheezing but has ongoing clear cough. She feels more short of breath than usual. She continues to have significant lower extremity lymphedema. Denies any  palpitations syncope or chest pain.  Past Medical History:  Diagnosis Date  . Bronchitis, mucopurulent recurrent ([place])   . Carpal tunnel syndrome    bilateral  . Chronic diastolic CHF (congestive heart failure) ([place])   . Chronic respiratory failure ([place])   . CKD (chronic kidney disease), stage III ([place])   . Diabetes mellitus ([place])   . Esophageal reflux   . Essential hypertension   . Former smoker   . Gout   . Lymphedema   . Morbid obesity ([place])   . Obesity hypoventilation syndrome ([place])   . On home oxygen therapy   . Osteoarthrosis, unspecified whether generalized or localized, unspecified site   . Other and unspecified hyperlipidemia   . Polyp of nasal cavity   . Vulva cancer ([place])    resected 2002    Past Surgical History:  Procedure Laterality Date  . CESAREAN SECTION    . COLONOSCOPY W/ POLYPECTOMY     negative polyps  . LYMPHADENECTOMY Bilateral    inguinal   . RADICAL VULVECTOMY    . TOTAL KNEE ARTHROPLASTY Right 01/2014   [facility] (Dr Rito Ehrlich)    Current Medications: Current Meds  Medication Sig  . albuterol (PROVENTIL HFA;VENTOLIN HFA) 108 (90 BASE) MCG/ACT inhaler INHALE 2 PUFFS INTO THE LUNGS EVERY 4 HOURS AS NEEDED FOR SHORTNESS OF BREATH  . albuterol (PROVENTIL) (2.5 MG/3ML) 0.083% nebulizer solution Use one unit dose in nebulizer every 4 hours as needed for wheezing  . allopurinol (ZYLOPRIM) 300 MG tablet Take 300  mg by mouth daily.  Marland Kitchen amitriptyline (ELAVIL) 10 MG tablet Take 10 mg by mouth at bedtime.   Marland Kitchen amLODipine (NORVASC) 5 MG tablet Take 1 tablet (5 mg total) by mouth daily.  Marland Kitchen aspirin 81 MG chewable tablet Chew 81 mg by mouth daily.  . budesonide-formoterol (SYMBICORT) 160-4.5 MCG/ACT inhaler Inhale 2 puffs into the lungs 2 (two) times daily.  . furosemide (LASIX) 80 MG tablet Take 1 tablet (80 mg total) by mouth 2 (two) times daily.  . metolazone (ZAROXOLYN) 2.5 MG tablet TAKE 1 TABLET BY MOUTH ON TUESDAY, THURSDAY AND SATURDAY  .  montelukast (SINGULAIR) 10 MG tablet Take 10 mg by mouth at bedtime.  Marland Kitchen oxybutynin (DITROPAN-XL) 10 MG 24 hr tablet Take 1 tablet by mouth daily.  . pantoprazole (PROTONIX) 40 MG tablet Take 40 mg by mouth daily.  . pravastatin (PRAVACHOL) 20 MG tablet Take 20 mg by mouth daily.  . sitaGLIPtin (JANUVIA) 50 MG tablet Take 50 mg by mouth daily.     Allergies:   Penicillins and Sulfa antibiotics   Social History   Socioeconomic History  . Marital status: Married    Spouse name: None  . Number of children: 1  . Years of education: None  . Highest education level: None  Social Needs  . Financial resource strain: None  . Food insecurity - worry: None  . Food insecurity - inability: None  . Transportation needs - medical: None  . Transportation needs - non-medical: None  Occupational History  . Occupation: retired  Tobacco Use  . Smoking status: Former Smoker    Packs/day: 1.50    Years: 25.00    Pack years: 37.50    Types: Cigarettes    Last attempt to quit: 09/05/1999    Years since quitting: 18.1  . Smokeless tobacco: Never Used  Substance and Sexual Activity  . Alcohol use: Yes  . Drug use: No  . Sexual activity: None  Other Topics Concern  . None  Social History Narrative   Disabled due to obesity and osteoarthritis     Family History:  Family History  Problem Relation Age of Onset  . Congestive Heart Failure Mother   . Kidney failure Mother   . Stroke Father   . Diabetes type II Father   . Hypertension Sister   . Heart attack Brother   . Healthy Sister   . Heart disease Brother   . COPD Brother   . Healthy Brother   . Healthy Brother   . Hypertension Unknown        family history  . Rheum arthritis Unknown        family history  . Stroke Unknown        family history    ROS:   Please see the history of present illness.  All other systems are reviewed and otherwise negative.    PHYSICAL EXAM:   VS:  BP 124/74 (BP Location: Right Arm, Patient  Position: Sitting, Cuff Size: Large)   Pulse 97   Ht 5\' 6"  (1.676 m)   Wt (!) 329 lb (149.2 kg)   SpO2 92%   BMI 53.10 kg/m   BMI: Body mass index is 53.1 kg/m. GEN: Well nourished, well developed morbidly obese WF in no acute distress  HEENT: normocephalic, atraumatic Neck: no JVD, carotid bruits, or masses Cardiac: RRR; no murmurs, rubs, or gallops, 3+ bilateral LE edema superimposed on large baseline leg habitus with chronic skin thickening changes Respiratory:  Crackles at the  bases bilaterally, normal work of breathing GI: soft, nontender, nondistended, + BS MS: no deformity or atrophy  Skin: warm and dry, no rash Neuro:  Alert and Oriented x 3, Strength and sensation are intact, follows commands Psych: euthymic mood, full affect  Wt Readings from Last 3 Encounters:  10/29/17 (!) 329 lb (149.2 kg)  07/05/17 (!) 330 lb 6.4 oz (149.9 kg)  06/06/17 (!) 355 lb (161 kg)    Studies/Labs Reviewed:   EKG:  EKG was not ordered today.  Recent Labs: 10/30/2016: ALT 19; B Natriuretic Peptide 163.9; Magnesium 1.9; TSH 1.507 07/05/2017: BUN 44; Creatinine, Ser 1.58; Hemoglobin 9.1; NT-Pro BNP 242; Platelets 372; Potassium 4.1; Sodium 134   Lipid Panel    Component Value Date/Time   CHOL 192 06/21/2016 0959   TRIG 128 06/21/2016 0959   HDL 47 06/21/2016 0959   CHOLHDL 4.1 06/21/2016 0959   VLDL 26 06/21/2016 0959   LDLCALC 119 06/21/2016 0959    Additional studies/ records that were reviewed today include: Summarized above.    ASSESSMENT & PLAN:   1. Acute on Chronic diastolic CHF - she has chronic lymphedema, however crackles on her lungs, considering she just underwent treatment for 2 acute bronchitis, I will order chest x-ray to distinguish between acute CHF and possible pneumonia as she has ongoing chills, no fever. We'll also obtain BMP and BNP today as well as CBC. 2. Morbid obesity - revisited the importance of this continuing to pose a risk to her health. I'm not sure  she's motivated for change at this time. 3. CKD stage III - followed by nephrology per Epic. We'll recheck labs today and adjust furosemide and metolazone based on results.  4. Essential HTN - controlled. 5. Diabetes mellitus with nephropathy - she requests we obtain f/u A1C today to send to her PCP.     Medication Adjustments/Labs and Tests Ordered: Current medicines are reviewed at length with the patient today.  Concerns regarding medicines are outlined above. Medication changes, Labs and Tests ordered today are summarized above and listed in the Patient Instructions accessible in Encounters.   Signed, Ena Dawley, MD  10/29/2017 9:17 AM    [address], [place], [place]  [address] Phone: [phone]; Fax: [phone]

## 2017-10-29 NOTE — Patient Instructions (Addendum)
Medication Instructions:  Your physician recommends that you continue on your current medications as directed. Please refer to the Current Medication list given to you today.  Labwork: BMET, CBC and Pro BNP today  Testing/Procedures: A chest x-ray takes a picture of the organs and structures inside the chest, including the heart, lungs, and blood vessels. This test can show several things, including, whether the heart is enlarges; whether fluid is building up in the lungs; and whether pacemaker / defibrillator leads are still in place.   Follow-Up: Your physician recommends that you schedule a follow-up appointment in: 4 weeks with Dr. Meda Coffee or a PA or NP.    Any Other Special Instructions Will Be Listed Below (If Applicable).     If you need a refill on your cardiac medications before your next appointment, please call your pharmacy.

## 2017-10-30 ENCOUNTER — Telehealth: Payer: Self-pay | Admitting: Cardiology

## 2017-10-30 NOTE — Telephone Encounter (Signed)
New Message   Pt is calling to check on the results to her chest xray. Please call

## 2017-10-30 NOTE — Telephone Encounter (Signed)
Informed the pt that her chest x ray results have not been faxed to Dr Meda Coffee yet to review, being she had this done in [place] vs [place].  Informed the pt that they have the correct number to fax her results to and once we receive them, Dr Meda Coffee will review this and advise, and I will follow-up with her shortly thereafter.  Pt verbalized understanding and agrees with this plan.

## 2017-10-31 NOTE — Telephone Encounter (Signed)
Spoke with the pt and informed her that Dr Meda Coffee reviewed her chest x ray results that were faxed to [place].  Informed the pt that per Dr Meda Coffee, she has no infection noted, still ongoing CHF, but she should continue her med regimen as advised at the last OV on 2/25. Pt verbalized understanding and agrees with this plan.

## 2017-11-19 ENCOUNTER — Ambulatory Visit: Payer: Medicare HMO | Admitting: Physician Assistant

## 2017-12-03 DEATH — deceased

## 2018-02-07 ENCOUNTER — Ambulatory Visit: Payer: Medicare HMO | Admitting: Cardiology
